# Patient Record
Sex: Female | Born: 1940 | Race: White | Hispanic: No | Marital: Married | Smoking: Never smoker
Health system: Southern US, Community
[De-identification: ages and names within clinical notes are randomized; demographics above are authoritative.]

## PROBLEM LIST (undated history)

## (undated) DIAGNOSIS — E119 Type 2 diabetes mellitus without complications: Secondary | ICD-10-CM

## (undated) DIAGNOSIS — M1990 Unspecified osteoarthritis, unspecified site: Secondary | ICD-10-CM

## (undated) DIAGNOSIS — R5600 Simple febrile convulsions: Secondary | ICD-10-CM

## (undated) DIAGNOSIS — T4145XA Adverse effect of unspecified anesthetic, initial encounter: Secondary | ICD-10-CM

## (undated) DIAGNOSIS — I10 Essential (primary) hypertension: Secondary | ICD-10-CM

## (undated) DIAGNOSIS — E785 Hyperlipidemia, unspecified: Secondary | ICD-10-CM

## (undated) DIAGNOSIS — I351 Nonrheumatic aortic (valve) insufficiency: Secondary | ICD-10-CM

## (undated) DIAGNOSIS — B029 Zoster without complications: Secondary | ICD-10-CM

## (undated) DIAGNOSIS — D649 Anemia, unspecified: Secondary | ICD-10-CM

## (undated) DIAGNOSIS — M810 Age-related osteoporosis without current pathological fracture: Secondary | ICD-10-CM

## (undated) DIAGNOSIS — G4700 Insomnia, unspecified: Secondary | ICD-10-CM

## (undated) DIAGNOSIS — R519 Headache, unspecified: Secondary | ICD-10-CM

## (undated) DIAGNOSIS — K219 Gastro-esophageal reflux disease without esophagitis: Secondary | ICD-10-CM

## (undated) DIAGNOSIS — Z8489 Family history of other specified conditions: Secondary | ICD-10-CM

## (undated) DIAGNOSIS — N189 Chronic kidney disease, unspecified: Secondary | ICD-10-CM

## (undated) DIAGNOSIS — S069X9A Unspecified intracranial injury with loss of consciousness of unspecified duration, initial encounter: Secondary | ICD-10-CM

## (undated) DIAGNOSIS — S069X1A Unspecified intracranial injury with loss of consciousness of 30 minutes or less, initial encounter: Secondary | ICD-10-CM

## (undated) DIAGNOSIS — E559 Vitamin D deficiency, unspecified: Secondary | ICD-10-CM

## (undated) DIAGNOSIS — F419 Anxiety disorder, unspecified: Secondary | ICD-10-CM

## (undated) DIAGNOSIS — E039 Hypothyroidism, unspecified: Secondary | ICD-10-CM

## (undated) DIAGNOSIS — T8859XA Other complications of anesthesia, initial encounter: Secondary | ICD-10-CM

## (undated) DIAGNOSIS — F32A Depression, unspecified: Secondary | ICD-10-CM

## (undated) DIAGNOSIS — J189 Pneumonia, unspecified organism: Secondary | ICD-10-CM

## (undated) DIAGNOSIS — F329 Major depressive disorder, single episode, unspecified: Secondary | ICD-10-CM

## (undated) DIAGNOSIS — R51 Headache: Secondary | ICD-10-CM

## (undated) HISTORY — PX: CATARACT EXTRACTION W/ INTRAOCULAR LENS  IMPLANT, BILATERAL: SHX1307

## (undated) HISTORY — PX: COLONOSCOPY W/ POLYPECTOMY: SHX1380

## (undated) HISTORY — PX: BREAST CYST EXCISION: SHX579

## (undated) HISTORY — DX: Nonrheumatic aortic (valve) insufficiency: I35.1

## (undated) HISTORY — PX: TONSILLECTOMY: SUR1361

## (undated) HISTORY — DX: Essential (primary) hypertension: I10

## (undated) HISTORY — PX: EYE SURGERY: SHX253

---

## 1972-08-25 HISTORY — PX: ORIF TIBIA & FIBULA FRACTURES: SHX2131

## 1972-08-25 HISTORY — PX: MANDIBLE FRACTURE SURGERY: SHX706

## 1986-08-25 HISTORY — PX: RIGHT OOPHORECTOMY: SHX2359

## 1986-08-25 HISTORY — PX: APPENDECTOMY: SHX54

## 1998-01-17 ENCOUNTER — Other Ambulatory Visit: Admission: RE | Admit: 1998-01-17 | Discharge: 1998-01-17 | Payer: Self-pay | Admitting: Family Medicine

## 1999-01-22 ENCOUNTER — Other Ambulatory Visit: Admission: RE | Admit: 1999-01-22 | Discharge: 1999-01-22 | Payer: Self-pay | Admitting: Family Medicine

## 2000-03-25 ENCOUNTER — Other Ambulatory Visit: Admission: RE | Admit: 2000-03-25 | Discharge: 2000-03-25 | Payer: Self-pay | Admitting: Family Medicine

## 2000-03-25 ENCOUNTER — Encounter: Payer: Self-pay | Admitting: Family Medicine

## 2000-03-25 ENCOUNTER — Encounter: Admission: RE | Admit: 2000-03-25 | Discharge: 2000-03-25 | Payer: Self-pay | Admitting: Family Medicine

## 2002-02-08 ENCOUNTER — Encounter: Admission: RE | Admit: 2002-02-08 | Discharge: 2002-02-08 | Payer: Self-pay | Admitting: Family Medicine

## 2002-02-08 ENCOUNTER — Encounter: Payer: Self-pay | Admitting: Family Medicine

## 2003-03-27 ENCOUNTER — Encounter: Admission: RE | Admit: 2003-03-27 | Discharge: 2003-03-27 | Payer: Self-pay | Admitting: Family Medicine

## 2003-03-27 ENCOUNTER — Encounter: Payer: Self-pay | Admitting: Family Medicine

## 2004-05-01 ENCOUNTER — Other Ambulatory Visit: Admission: RE | Admit: 2004-05-01 | Discharge: 2004-05-01 | Payer: Self-pay | Admitting: Family Medicine

## 2004-08-12 ENCOUNTER — Encounter (INDEPENDENT_AMBULATORY_CARE_PROVIDER_SITE_OTHER): Payer: Self-pay | Admitting: *Deleted

## 2004-08-12 ENCOUNTER — Ambulatory Visit (HOSPITAL_COMMUNITY): Admission: RE | Admit: 2004-08-12 | Discharge: 2004-08-12 | Payer: Self-pay | Admitting: Gastroenterology

## 2005-08-27 ENCOUNTER — Encounter: Admission: RE | Admit: 2005-08-27 | Discharge: 2005-08-27 | Payer: Self-pay | Admitting: Family Medicine

## 2006-08-12 ENCOUNTER — Other Ambulatory Visit: Admission: RE | Admit: 2006-08-12 | Discharge: 2006-08-12 | Payer: Self-pay | Admitting: Family Medicine

## 2006-09-03 ENCOUNTER — Encounter: Admission: RE | Admit: 2006-09-03 | Discharge: 2006-09-03 | Payer: Self-pay | Admitting: Family Medicine

## 2008-04-17 ENCOUNTER — Other Ambulatory Visit: Admission: RE | Admit: 2008-04-17 | Discharge: 2008-04-17 | Payer: Self-pay | Admitting: Family Medicine

## 2009-04-06 ENCOUNTER — Encounter: Admission: RE | Admit: 2009-04-06 | Discharge: 2009-04-06 | Payer: Self-pay | Admitting: Family Medicine

## 2009-06-19 ENCOUNTER — Encounter: Admission: RE | Admit: 2009-06-19 | Discharge: 2009-06-19 | Payer: Self-pay | Admitting: Family Medicine

## 2010-02-16 ENCOUNTER — Encounter: Admission: RE | Admit: 2010-02-16 | Discharge: 2010-02-16 | Payer: Self-pay | Admitting: Family Medicine

## 2010-04-08 ENCOUNTER — Encounter: Admission: RE | Admit: 2010-04-08 | Discharge: 2010-04-08 | Payer: Self-pay | Admitting: Neurosurgery

## 2010-05-16 ENCOUNTER — Encounter: Admission: RE | Admit: 2010-05-16 | Discharge: 2010-05-16 | Payer: Self-pay | Admitting: Family Medicine

## 2011-01-10 NOTE — Op Note (Signed)
April Franco, April Franco                ACCOUNT NO.:  [id number]   MEDICAL RECORD NO.:  [id number]          PATIENT TYPE:  AMB   LOCATION:  ENDO                         FACILITY:  [facility]   PHYSICIAN:  Petra Kuba, M.D.    DATE OF BIRTH:  [birth year]   DATE OF PROCEDURE:  08/12/2004  DATE OF DISCHARGE:                                 OPERATIVE REPORT   PROCEDURE PERFORMED:  Colonoscopy with hot biopsy.   ENDOSCOPIST:  Petra Kuba, M.D.   INDICATIONS FOR PROCEDURE:  Screening.   Consent was signed after the risks, benefits, methods and options were  thoroughly discussed in the office.   MEDICINES USED:  Demerol 50 mg, Versed 5 mg.   DESCRIPTION OF PROCEDURE:  Rectal inspection was pertinent for external  hemorrhoids, small.  Digital exam was negative.  A video pediatric  adjustable colonoscope was inserted and easily advanced around the colon to  the cecum.  This did not require any abdominal pressure or any position  changes.  Left-sided diverticula were seen on insertion but no other  abnormalities.  The cecum was identified by the appendiceal orifice and the  ileocecal valve.  The scope was slowly withdrawn.  The prep on the left was  better than the prep on the right.  It did require 1 L of washing and  suctioning overall for adequate visualization.  On slow withdrawal through  the colon, the cecum, ascending, majority of the transverse was normal.  In  the distal transverse, an edematous fold versus polyp was seen and was hot  biopsied times one.  The scope was further withdrawn.  About five other tiny  to small descending and sigmoid polyps were seen and were all hot biopsied  and put in the same container.  Left-sided diverticula, scattered were  confirmed.  Anorectal pull-through and retroflexion confirmed some small  hemorrhoids.  Scope was straightened and readvanced a short ways up the left  side of the colon, air was suctioned, scope removed.  The patient tolerated  the  procedure well.  The only complication was a bout of hypotension prior  to starting the procedure after the IV was put in.  No other complications.   ENDOSCOPIC DIAGNOSIS:  1.  Internal and external hemorrhoids.  2.  Left-sided diverticula.  3.  Multiple tiny left-sided polyps hot biopsied.  4.  Distal transverse questionable edematous fold hot biopsied.  5.  Otherwise within normal limits to the cecum.   PLAN:  Await pathology to determine the future of colonic screening.  Happy  to see back p.r.n.  Otherwise return care to Dr. Clarene Duke for the customary  health care maintenance to include yearly rectals and guaiacs.       MEM/MEDQ  D:  08/12/2004  T:  08/13/2004  Job:  [id number]   cc:   Caryn Bee L. Little, M.D.  [address]  [place]  [place] [address]  Fax: [phone]

## 2013-04-20 ENCOUNTER — Other Ambulatory Visit: Payer: Self-pay | Admitting: Family Medicine

## 2013-04-20 DIAGNOSIS — Z1231 Encounter for screening mammogram for malignant neoplasm of breast: Secondary | ICD-10-CM

## 2013-04-20 DIAGNOSIS — M810 Age-related osteoporosis without current pathological fracture: Secondary | ICD-10-CM

## 2013-07-12 ENCOUNTER — Other Ambulatory Visit: Payer: Self-pay

## 2013-07-12 ENCOUNTER — Ambulatory Visit: Payer: Self-pay

## 2013-08-10 ENCOUNTER — Ambulatory Visit
Admission: RE | Admit: 2013-08-10 | Discharge: 2013-08-10 | Disposition: A | Discharge status: Home / Self Care | Payer: Medicare Other | Source: Ambulatory Visit | Attending: Family Medicine | Admitting: Family Medicine

## 2013-08-10 DIAGNOSIS — M810 Age-related osteoporosis without current pathological fracture: Secondary | ICD-10-CM

## 2013-08-10 DIAGNOSIS — Z1231 Encounter for screening mammogram for malignant neoplasm of breast: Secondary | ICD-10-CM

## 2014-07-18 ENCOUNTER — Other Ambulatory Visit: Payer: Self-pay | Admitting: Orthopedic Surgery

## 2014-08-07 ENCOUNTER — Ambulatory Visit (HOSPITAL_COMMUNITY)
Admission: RE | Admit: 2014-08-07 | Discharge: 2014-08-07 | Disposition: A | Discharge status: Home / Self Care | Payer: Medicare Other | Source: Ambulatory Visit | Attending: Orthopedic Surgery | Admitting: Orthopedic Surgery

## 2014-08-07 ENCOUNTER — Encounter (HOSPITAL_COMMUNITY)
Admission: RE | Admit: 2014-08-07 | Discharge: 2014-08-07 | Disposition: A | Discharge status: Home / Self Care | Payer: Medicare Other | Source: Ambulatory Visit | Attending: Orthopedic Surgery | Admitting: Orthopedic Surgery

## 2014-08-07 ENCOUNTER — Encounter (HOSPITAL_COMMUNITY): Payer: Self-pay

## 2014-08-07 DIAGNOSIS — Z01818 Encounter for other preprocedural examination: Secondary | ICD-10-CM | POA: Diagnosis not present

## 2014-08-07 DIAGNOSIS — M47894 Other spondylosis, thoracic region: Secondary | ICD-10-CM | POA: Diagnosis not present

## 2014-08-07 HISTORY — DX: Major depressive disorder, single episode, unspecified: F32.9

## 2014-08-07 HISTORY — DX: Anxiety disorder, unspecified: F41.9

## 2014-08-07 HISTORY — DX: Insomnia, unspecified: G47.00

## 2014-08-07 HISTORY — DX: Unspecified osteoarthritis, unspecified site: M19.90

## 2014-08-07 HISTORY — DX: Essential (primary) hypertension: I10

## 2014-08-07 HISTORY — DX: Adverse effect of unspecified anesthetic, initial encounter: T41.45XA

## 2014-08-07 HISTORY — DX: Hyperlipidemia, unspecified: E78.5

## 2014-08-07 HISTORY — DX: Pneumonia, unspecified organism: J18.9

## 2014-08-07 HISTORY — DX: Other complications of anesthesia, initial encounter: T88.59XA

## 2014-08-07 HISTORY — DX: Hypothyroidism, unspecified: E03.9

## 2014-08-07 HISTORY — DX: Depression, unspecified: F32.A

## 2014-08-07 LAB — URINALYSIS, ROUTINE W REFLEX MICROSCOPIC
BILIRUBIN URINE: NEGATIVE
GLUCOSE, UA: NEGATIVE mg/dL
Ketones, ur: NEGATIVE mg/dL
Nitrite: NEGATIVE
PROTEIN: NEGATIVE mg/dL
Specific Gravity, Urine: 1.014 (ref 1.005–1.030)
UROBILINOGEN UA: 0.2 mg/dL (ref 0.0–1.0)
pH: 6 (ref 5.0–8.0)

## 2014-08-07 LAB — APTT: aPTT: 32 seconds (ref 24–37)

## 2014-08-07 LAB — COMPREHENSIVE METABOLIC PANEL
ALK PHOS: 63 U/L (ref 39–117)
ALT: 24 U/L (ref 0–35)
ANION GAP: 17 — AB (ref 5–15)
AST: 18 U/L (ref 0–37)
Albumin: 4 g/dL (ref 3.5–5.2)
BUN: 15 mg/dL (ref 6–23)
CALCIUM: 10.9 mg/dL — AB (ref 8.4–10.5)
CO2: 25 mEq/L (ref 19–32)
CREATININE: 0.8 mg/dL (ref 0.50–1.10)
Chloride: 97 mEq/L (ref 96–112)
GFR calc Af Amer: 84 mL/min — ABNORMAL LOW (ref >90)
GFR calc non Af Amer: 72 mL/min — ABNORMAL LOW (ref >90)
Glucose, Bld: 159 mg/dL — ABNORMAL HIGH (ref 70–99)
POTASSIUM: 3.9 meq/L (ref 3.7–5.3)
Sodium: 139 mEq/L (ref 137–147)
TOTAL PROTEIN: 7.5 g/dL (ref 6.0–8.3)
Total Bilirubin: 0.3 mg/dL (ref 0.3–1.2)

## 2014-08-07 LAB — CBC WITH DIFFERENTIAL/PLATELET
BASOS PCT: 1 % (ref 0–1)
Basophils Absolute: 0.1 10*3/uL (ref 0.0–0.1)
EOS ABS: 0.4 10*3/uL (ref 0.0–0.7)
EOS PCT: 6 % — AB (ref 0–5)
HCT: 40.9 % (ref 36.0–46.0)
HEMOGLOBIN: 13.3 g/dL (ref 12.0–15.0)
Lymphocytes Relative: 29 % (ref 12–46)
Lymphs Abs: 2.1 10*3/uL (ref 0.7–4.0)
MCH: 29.1 pg (ref 26.0–34.0)
MCHC: 32.5 g/dL (ref 30.0–36.0)
MCV: 89.5 fL (ref 78.0–100.0)
MONOS PCT: 6 % (ref 3–12)
Monocytes Absolute: 0.4 10*3/uL (ref 0.1–1.0)
NEUTROS PCT: 58 % (ref 43–77)
Neutro Abs: 4.3 10*3/uL (ref 1.7–7.7)
Platelets: 400 10*3/uL (ref 150–400)
RBC: 4.57 MIL/uL (ref 3.87–5.11)
RDW: 14 % (ref 11.5–15.5)
WBC: 7.4 10*3/uL (ref 4.0–10.5)

## 2014-08-07 LAB — TYPE AND SCREEN
ABO/RH(D): O NEG
Antibody Screen: NEGATIVE

## 2014-08-07 LAB — SURGICAL PCR SCREEN
MRSA, PCR: NEGATIVE
STAPHYLOCOCCUS AUREUS: NEGATIVE

## 2014-08-07 LAB — URINE MICROSCOPIC-ADD ON

## 2014-08-07 LAB — PROTIME-INR
INR: 0.97 (ref 0.00–1.49)
PROTHROMBIN TIME: 13 s (ref 11.6–15.2)

## 2014-08-07 LAB — ABO/RH: ABO/RH(D): O NEG

## 2014-08-07 NOTE — Pre-Procedure Instructions (Signed)
April Franco  08/07/2014   Your procedure is scheduled on:  Wednesday August 16, 2014 at 12:45 PM.  Report to [facility] Admitting at 10:45 AM.  Call this number if you have problems the morning of surgery: [phone]   For any other questions call [phone] M-F from 8am-4pm   Remember:   Do not eat food or drink liquids after midnight.   Take these medicines the morning of surgery with A SIP OF WATER: Amlodipine, Levothyroxine, Escitalopram, Tramadol if needed, Alprazolam if needed, and Hydrocodone if needed     Do NOT take any diabetic medications the morning of your surgery   Do not wear jewelry, make-up or nail polish.  Do not wear lotions, powders, or perfumes.  Do not shave 48 hours prior to surgery.   Do not bring valuables to the hospital.  [facility] is not responsible for any belongings or valuables.               Contacts, dentures or bridgework may not be worn into surgery.  Leave suitcase in the car. After surgery it may be brought to your room.  For patients admitted to the hospital, discharge time is determined by your treatment team.               Patients discharged the day of surgery will not be allowed to drive home.  Name and phone number of your driver:   Special Instructions: Shower using CHG soap the night before and the morning of your surgery    Please read over the following fact sheets that you were given: Pain Booklet, Coughing and Deep Breathing, Blood Transfusion Information, Total Joint Packet, MRSA Information and Surgical Site Infection Prevention

## 2014-08-07 NOTE — Progress Notes (Signed)
PCP is Hulan Fess. Patient denied having any acute cardiac or pulmonary issues.

## 2014-08-08 NOTE — Progress Notes (Signed)
Anesthesia Chart Review:  Pt is 73 year old female scheduled for L total knee arthroplasty on 08/16/2014 with Dr. Berenice Primas.   PMH: HTN, DM, hyperlipidemia. BMI 39  Preoperative labs reviewed.    EKG: Sinus rhythm with occasional PVCs. Nonspecific T wave abnormality. No significant change since last tracing.   If no changes, I anticipate pt can proceed with surgery as scheduled.   Willeen Cass, FNP-BC [facility]/Anesthesiology Phone: [phone] 08/08/2014 4:02 PM

## 2014-08-15 MED ORDER — CHLORHEXIDINE GLUCONATE 4 % EX LIQD
60.0000 mL | Freq: Once | CUTANEOUS | Status: DC
  Filled 2014-08-15: qty 60

## 2014-08-15 MED ORDER — CEFAZOLIN SODIUM-DEXTROSE 2-3 GM-% IV SOLR
2.0000 g | INTRAVENOUS | Status: AC
  Administered 2014-08-16: 2 g via INTRAVENOUS
  Filled 2014-08-15: qty 50

## 2014-08-16 ENCOUNTER — Inpatient Hospital Stay (HOSPITAL_COMMUNITY)
Admission: RE | Admit: 2014-08-16 | Discharge: 2014-08-19 | DRG: 982 | Disposition: A | Discharge status: Home Health | Payer: Medicare Other | Source: Ambulatory Visit | Attending: Orthopedic Surgery | Admitting: Orthopedic Surgery

## 2014-08-16 ENCOUNTER — Inpatient Hospital Stay (HOSPITAL_COMMUNITY): Payer: Medicare Other | Admitting: Anesthesiology

## 2014-08-16 ENCOUNTER — Inpatient Hospital Stay (HOSPITAL_COMMUNITY): Payer: Medicare Other | Admitting: Emergency Medicine

## 2014-08-16 ENCOUNTER — Encounter (HOSPITAL_COMMUNITY)
Admission: RE | Disposition: A | Discharge status: Home Health | Payer: Self-pay | Source: Ambulatory Visit | Attending: Orthopedic Surgery

## 2014-08-16 ENCOUNTER — Encounter (HOSPITAL_COMMUNITY): Payer: Self-pay | Admitting: *Deleted

## 2014-08-16 DIAGNOSIS — Z7982 Long term (current) use of aspirin: Secondary | ICD-10-CM

## 2014-08-16 DIAGNOSIS — E119 Type 2 diabetes mellitus without complications: Secondary | ICD-10-CM | POA: Diagnosis present

## 2014-08-16 DIAGNOSIS — Z79899 Other long term (current) drug therapy: Secondary | ICD-10-CM | POA: Diagnosis not present

## 2014-08-16 DIAGNOSIS — E039 Hypothyroidism, unspecified: Secondary | ICD-10-CM | POA: Diagnosis present

## 2014-08-16 DIAGNOSIS — D62 Acute posthemorrhagic anemia: Secondary | ICD-10-CM | POA: Diagnosis not present

## 2014-08-16 DIAGNOSIS — Z6838 Body mass index (BMI) 38.0-38.9, adult: Secondary | ICD-10-CM

## 2014-08-16 DIAGNOSIS — I10 Essential (primary) hypertension: Secondary | ICD-10-CM | POA: Diagnosis present

## 2014-08-16 DIAGNOSIS — M1712 Unilateral primary osteoarthritis, left knee: Secondary | ICD-10-CM | POA: Diagnosis present

## 2014-08-16 DIAGNOSIS — F419 Anxiety disorder, unspecified: Secondary | ICD-10-CM | POA: Diagnosis present

## 2014-08-16 DIAGNOSIS — E785 Hyperlipidemia, unspecified: Secondary | ICD-10-CM | POA: Diagnosis present

## 2014-08-16 DIAGNOSIS — F329 Major depressive disorder, single episode, unspecified: Secondary | ICD-10-CM | POA: Diagnosis present

## 2014-08-16 DIAGNOSIS — M25562 Pain in left knee: Secondary | ICD-10-CM | POA: Diagnosis present

## 2014-08-16 DIAGNOSIS — G4700 Insomnia, unspecified: Secondary | ICD-10-CM | POA: Diagnosis present

## 2014-08-16 HISTORY — DX: Anemia, unspecified: D64.9

## 2014-08-16 HISTORY — DX: Headache: R51

## 2014-08-16 HISTORY — DX: Type 2 diabetes mellitus without complications: E11.9

## 2014-08-16 HISTORY — DX: Headache, unspecified: R51.9

## 2014-08-16 HISTORY — PX: TOTAL KNEE ARTHROPLASTY: SHX125

## 2014-08-16 HISTORY — DX: Simple febrile convulsions: R56.00

## 2014-08-16 LAB — GLUCOSE, CAPILLARY
GLUCOSE-CAPILLARY: 162 mg/dL — AB (ref 70–99)
GLUCOSE-CAPILLARY: 113 mg/dL — AB (ref 70–99)
Glucose-Capillary: 129 mg/dL — ABNORMAL HIGH (ref 70–99)
Glucose-Capillary: 190 mg/dL — ABNORMAL HIGH (ref 70–99)

## 2014-08-16 SURGERY — ARTHROPLASTY, KNEE, TOTAL
Anesthesia: Monitor Anesthesia Care | Laterality: Left

## 2014-08-16 MED ORDER — ZOLPIDEM TARTRATE 5 MG PO TABS
5.0000 mg | ORAL_TABLET | Freq: Every evening | ORAL | Status: DC | PRN
  Administered 2014-08-18: 5 mg via ORAL
  Filled 2014-08-16: qty 1

## 2014-08-16 MED ORDER — BISACODYL 5 MG PO TBEC
5.0000 mg | DELAYED_RELEASE_TABLET | Freq: Every day | ORAL | Status: DC | PRN
  Administered 2014-08-18: 5 mg via ORAL
  Filled 2014-08-16: qty 1

## 2014-08-16 MED ORDER — HYDROMORPHONE HCL 1 MG/ML IJ SOLN
INTRAMUSCULAR | Status: AC
  Filled 2014-08-16: qty 1

## 2014-08-16 MED ORDER — BUPIVACAINE HCL (PF) 0.25 % IJ SOLN
INTRAMUSCULAR | Status: DC | PRN
  Administered 2014-08-16: 20 mL

## 2014-08-16 MED ORDER — LACTATED RINGERS IV SOLN
INTRAVENOUS | Status: DC
  Administered 2014-08-16 (×2): via INTRAVENOUS

## 2014-08-16 MED ORDER — ESCITALOPRAM OXALATE 20 MG PO TABS
20.0000 mg | ORAL_TABLET | Freq: Every day | ORAL | Status: DC
  Administered 2014-08-17 – 2014-08-19 (×3): 20 mg via ORAL
  Filled 2014-08-16 (×3): qty 1

## 2014-08-16 MED ORDER — TRANEXAMIC ACID 100 MG/ML IV SOLN
1000.0000 mg | INTRAVENOUS | Status: DC
  Filled 2014-08-16: qty 10

## 2014-08-16 MED ORDER — ASPIRIN EC 325 MG PO TBEC
325.0000 mg | DELAYED_RELEASE_TABLET | Freq: Two times a day (BID) | ORAL | Status: DC
  Administered 2014-08-17 – 2014-08-19 (×5): 325 mg via ORAL
  Filled 2014-08-16 (×8): qty 1

## 2014-08-16 MED ORDER — ACETAMINOPHEN 160 MG/5ML PO SOLN: 325.0000 mg | ORAL | Status: DC | PRN

## 2014-08-16 MED ORDER — OXYCODONE-ACETAMINOPHEN 5-325 MG PO TABS
1.0000 | ORAL_TABLET | ORAL | Status: DC | PRN
  Administered 2014-08-16 – 2014-08-19 (×13): 2 via ORAL
  Filled 2014-08-16 (×14): qty 2

## 2014-08-16 MED ORDER — SODIUM CHLORIDE 0.9 % IJ SOLN
INTRAMUSCULAR | Status: AC
  Filled 2014-08-16: qty 10

## 2014-08-16 MED ORDER — LIDOCAINE HCL (CARDIAC) 20 MG/ML IV SOLN
INTRAVENOUS | Status: AC
  Filled 2014-08-16: qty 5

## 2014-08-16 MED ORDER — HYDROMORPHONE HCL 1 MG/ML IJ SOLN
0.2500 mg | INTRAMUSCULAR | Status: DC | PRN
  Administered 2014-08-16 (×4): 0.5 mg via INTRAVENOUS

## 2014-08-16 MED ORDER — GLIMEPIRIDE 2 MG PO TABS
2.0000 mg | ORAL_TABLET | Freq: Every day | ORAL | Status: DC
  Administered 2014-08-17 – 2014-08-19 (×3): 2 mg via ORAL
  Filled 2014-08-16 (×4): qty 1

## 2014-08-16 MED ORDER — METFORMIN HCL 500 MG PO TABS
1000.0000 mg | ORAL_TABLET | Freq: Two times a day (BID) | ORAL | Status: DC
  Administered 2014-08-17: 1000 mg via ORAL
  Administered 2014-08-18: 500 mg via ORAL
  Administered 2014-08-18 – 2014-08-19 (×2): 1000 mg via ORAL
  Filled 2014-08-16 (×8): qty 2

## 2014-08-16 MED ORDER — OXYCODONE HCL 5 MG PO TABS: 5.0000 mg | ORAL_TABLET | Freq: Once | ORAL | Status: DC | PRN

## 2014-08-16 MED ORDER — ACETAMINOPHEN 325 MG PO TABS: 325.0000 mg | ORAL_TABLET | ORAL | Status: DC | PRN

## 2014-08-16 MED ORDER — LEVOTHYROXINE SODIUM 50 MCG PO TABS
50.0000 ug | ORAL_TABLET | Freq: Every day | ORAL | Status: DC
  Administered 2014-08-17 – 2014-08-19 (×3): 50 ug via ORAL
  Filled 2014-08-16 (×4): qty 1

## 2014-08-16 MED ORDER — FENTANYL CITRATE 0.05 MG/ML IJ SOLN
INTRAMUSCULAR | Status: DC | PRN
  Administered 2014-08-16 (×5): 50 ug via INTRAVENOUS

## 2014-08-16 MED ORDER — PROPOFOL 10 MG/ML IV BOLUS
INTRAVENOUS | Status: AC
  Filled 2014-08-16: qty 20

## 2014-08-16 MED ORDER — FENTANYL CITRATE 0.05 MG/ML IJ SOLN
50.0000 ug | INTRAMUSCULAR | Status: DC | PRN
  Filled 2014-08-16: qty 2

## 2014-08-16 MED ORDER — ACETAMINOPHEN 650 MG RE SUPP: 650.0000 mg | Freq: Four times a day (QID) | RECTAL | Status: DC | PRN

## 2014-08-16 MED ORDER — HYDROMORPHONE HCL 1 MG/ML IJ SOLN
INTRAMUSCULAR | Status: DC | PRN
  Administered 2014-08-16 (×2): .5 mg via INTRAVENOUS

## 2014-08-16 MED ORDER — 0.9 % SODIUM CHLORIDE (POUR BTL) OPTIME
TOPICAL | Status: DC | PRN
  Administered 2014-08-16: 1000 mL

## 2014-08-16 MED ORDER — KETOROLAC TROMETHAMINE 15 MG/ML IJ SOLN
INTRAMUSCULAR | Status: AC
Start: 2014-08-16 — End: 2014-08-16
  Filled 2014-08-16: qty 1

## 2014-08-16 MED ORDER — BUPIVACAINE LIPOSOME 1.3 % IJ SUSP
20.0000 mL | Freq: Once | INTRAMUSCULAR | Status: AC
  Administered 2014-08-16: 20 mL
  Filled 2014-08-16: qty 20

## 2014-08-16 MED ORDER — AMLODIPINE BESYLATE 10 MG PO TABS
10.0000 mg | ORAL_TABLET | Freq: Every day | ORAL | Status: DC
  Administered 2014-08-18 – 2014-08-19 (×2): 10 mg via ORAL
  Filled 2014-08-16 (×4): qty 1

## 2014-08-16 MED ORDER — CEFAZOLIN SODIUM-DEXTROSE 2-3 GM-% IV SOLR
2.0000 g | Freq: Four times a day (QID) | INTRAVENOUS | Status: AC
  Administered 2014-08-16 – 2014-08-17 (×2): 2 g via INTRAVENOUS
  Filled 2014-08-16 (×2): qty 50

## 2014-08-16 MED ORDER — ALPRAZOLAM 0.25 MG PO TABS
0.2500 mg | ORAL_TABLET | Freq: Two times a day (BID) | ORAL | Status: DC | PRN
  Administered 2014-08-17 – 2014-08-18 (×4): 0.25 mg via ORAL
  Filled 2014-08-16 (×4): qty 1

## 2014-08-16 MED ORDER — BUPIVACAINE HCL (PF) 0.25 % IJ SOLN
INTRAMUSCULAR | Status: AC
  Filled 2014-08-16: qty 30

## 2014-08-16 MED ORDER — DOCUSATE SODIUM 100 MG PO CAPS
100.0000 mg | ORAL_CAPSULE | Freq: Two times a day (BID) | ORAL | Status: DC
  Administered 2014-08-16 – 2014-08-19 (×6): 100 mg via ORAL
  Filled 2014-08-16 (×7): qty 1

## 2014-08-16 MED ORDER — HYDROXYZINE HCL 25 MG PO TABS
25.0000 mg | ORAL_TABLET | Freq: Three times a day (TID) | ORAL | Status: DC | PRN
  Administered 2014-08-17: 25 mg via ORAL
  Filled 2014-08-16: qty 1

## 2014-08-16 MED ORDER — METHOCARBAMOL 1000 MG/10ML IJ SOLN
500.0000 mg | Freq: Four times a day (QID) | INTRAVENOUS | Status: DC | PRN
  Filled 2014-08-16: qty 5

## 2014-08-16 MED ORDER — PROMETHAZINE HCL 25 MG/ML IJ SOLN: 6.2500 mg | Freq: Four times a day (QID) | INTRAMUSCULAR | Status: DC | PRN

## 2014-08-16 MED ORDER — LISINOPRIL 20 MG PO TABS
20.0000 mg | ORAL_TABLET | Freq: Every day | ORAL | Status: DC
  Administered 2014-08-18 – 2014-08-19 (×2): 20 mg via ORAL
  Filled 2014-08-16 (×3): qty 1

## 2014-08-16 MED ORDER — ASPIRIN EC 325 MG PO TBEC: 325.0000 mg | DELAYED_RELEASE_TABLET | Freq: Two times a day (BID) | ORAL | Status: DC

## 2014-08-16 MED ORDER — ONDANSETRON HCL 4 MG PO TABS: 4.0000 mg | ORAL_TABLET | Freq: Four times a day (QID) | ORAL | Status: DC | PRN

## 2014-08-16 MED ORDER — HYDROMORPHONE HCL 1 MG/ML IJ SOLN
0.5000 mg | INTRAMUSCULAR | Status: DC | PRN
  Administered 2014-08-16 – 2014-08-18 (×7): 1 mg via INTRAVENOUS
  Filled 2014-08-16 (×9): qty 1

## 2014-08-16 MED ORDER — MIDAZOLAM HCL 2 MG/2ML IJ SOLN
INTRAMUSCULAR | Status: AC
  Filled 2014-08-16: qty 2

## 2014-08-16 MED ORDER — SODIUM CHLORIDE 0.9 % IV SOLN
INTRAVENOUS | Status: DC
  Administered 2014-08-16 – 2014-08-17 (×2): via INTRAVENOUS

## 2014-08-16 MED ORDER — HYDROCHLOROTHIAZIDE 25 MG PO TABS
25.0000 mg | ORAL_TABLET | ORAL | Status: DC
  Administered 2014-08-19: 25 mg via ORAL
  Filled 2014-08-16 (×2): qty 1

## 2014-08-16 MED ORDER — PROPOFOL INFUSION 10 MG/ML OPTIME
INTRAVENOUS | Status: DC | PRN
  Administered 2014-08-16: 50 ug/kg/min via INTRAVENOUS

## 2014-08-16 MED ORDER — KETOROLAC TROMETHAMINE 15 MG/ML IJ SOLN
7.5000 mg | Freq: Three times a day (TID) | INTRAMUSCULAR | Status: AC
  Administered 2014-08-16 – 2014-08-17 (×3): 7.5 mg via INTRAVENOUS
  Filled 2014-08-16 (×2): qty 1

## 2014-08-16 MED ORDER — ACETAMINOPHEN 325 MG PO TABS
650.0000 mg | ORAL_TABLET | Freq: Four times a day (QID) | ORAL | Status: DC | PRN
  Administered 2014-08-18 – 2014-08-19 (×2): 650 mg via ORAL
  Filled 2014-08-16 (×2): qty 2

## 2014-08-16 MED ORDER — SODIUM CHLORIDE 0.9 % IR SOLN
Status: DC | PRN
  Administered 2014-08-16: 1000 mL

## 2014-08-16 MED ORDER — INSULIN ASPART 100 UNIT/ML ~~LOC~~ SOLN
0.0000 [IU] | Freq: Three times a day (TID) | SUBCUTANEOUS | Status: DC
  Administered 2014-08-17: 3 [IU] via SUBCUTANEOUS
  Administered 2014-08-17 – 2014-08-19 (×3): 2 [IU] via SUBCUTANEOUS

## 2014-08-16 MED ORDER — BUPIVACAINE IN DEXTROSE 0.75-8.25 % IT SOLN
INTRATHECAL | Status: DC | PRN
  Administered 2014-08-16: 1.8 mL via INTRATHECAL

## 2014-08-16 MED ORDER — DIPHENHYDRAMINE HCL 12.5 MG/5ML PO ELIX
12.5000 mg | ORAL_SOLUTION | ORAL | Status: DC | PRN
  Administered 2014-08-17 (×5): 25 mg via ORAL
  Filled 2014-08-16 (×5): qty 10

## 2014-08-16 MED ORDER — OXYCODONE HCL 5 MG/5ML PO SOLN: 5.0000 mg | Freq: Once | ORAL | Status: DC | PRN

## 2014-08-16 MED ORDER — TRANEXAMIC ACID 100 MG/ML IV SOLN
1000.0000 mg | INTRAVENOUS | Status: DC
  Administered 2014-08-16: 1000 mg via INTRAVENOUS

## 2014-08-16 MED ORDER — METHOCARBAMOL 500 MG PO TABS: 500.0000 mg | ORAL_TABLET | Freq: Three times a day (TID) | ORAL | Status: DC | PRN

## 2014-08-16 MED ORDER — FENTANYL CITRATE 0.05 MG/ML IJ SOLN
INTRAMUSCULAR | Status: AC
  Filled 2014-08-16: qty 5

## 2014-08-16 MED ORDER — POLYETHYLENE GLYCOL 3350 17 G PO PACK: 17.0000 g | PACK | Freq: Every day | ORAL | Status: DC | PRN

## 2014-08-16 MED ORDER — FLEET ENEMA 7-19 GM/118ML RE ENEM: 1.0000 | ENEMA | Freq: Once | RECTAL | Status: AC | PRN

## 2014-08-16 MED ORDER — ONDANSETRON HCL 4 MG/2ML IJ SOLN: 4.0000 mg | Freq: Four times a day (QID) | INTRAMUSCULAR | Status: DC | PRN

## 2014-08-16 MED ORDER — MIDAZOLAM HCL 2 MG/2ML IJ SOLN
1.0000 mg | INTRAMUSCULAR | Status: DC | PRN
  Administered 2014-08-16: 2 mg via INTRAVENOUS
  Filled 2014-08-16: qty 2

## 2014-08-16 MED ORDER — LIDOCAINE HCL (CARDIAC) 20 MG/ML IV SOLN
INTRAVENOUS | Status: DC | PRN
  Administered 2014-08-16: 40 mg via INTRAVENOUS

## 2014-08-16 MED ORDER — OXYCODONE HCL 5 MG PO TABS
5.0000 mg | ORAL_TABLET | ORAL | Status: DC | PRN
  Administered 2014-08-17 – 2014-08-19 (×9): 5 mg via ORAL
  Filled 2014-08-16 (×8): qty 1

## 2014-08-16 MED ORDER — METHOCARBAMOL 500 MG PO TABS
500.0000 mg | ORAL_TABLET | Freq: Four times a day (QID) | ORAL | Status: DC | PRN
  Administered 2014-08-16 – 2014-08-18 (×8): 500 mg via ORAL
  Filled 2014-08-16 (×8): qty 1

## 2014-08-16 MED ORDER — HYDROMORPHONE HCL 1 MG/ML IJ SOLN
0.5000 mg | INTRAMUSCULAR | Status: AC | PRN
  Administered 2014-08-16: 1 mg via INTRAVENOUS
  Filled 2014-08-16: qty 1

## 2014-08-16 MED ORDER — OXYCODONE-ACETAMINOPHEN 5-325 MG PO TABS: 1.0000 | ORAL_TABLET | Freq: Four times a day (QID) | ORAL | Status: DC | PRN

## 2014-08-16 MED ORDER — ALUM & MAG HYDROXIDE-SIMETH 200-200-20 MG/5ML PO SUSP: 30.0000 mL | ORAL | Status: DC | PRN

## 2014-08-16 SURGICAL SUPPLY — 69 items
APL SKNCLS STERI-STRIP NONHPOA (GAUZE/BANDAGES/DRESSINGS) ×1
BANDAGE ESMARK 6X9 LF (GAUZE/BANDAGES/DRESSINGS) ×1 IMPLANT
BENZOIN TINCTURE PRP APPL 2/3 (GAUZE/BANDAGES/DRESSINGS) ×3 IMPLANT
BLADE SAGITTAL 25.0X1.19X90 (BLADE) ×2 IMPLANT
BLADE SAGITTAL 25.0X1.19X90MM (BLADE) ×1
BLADE SAW SAG 90X13X1.27 (BLADE) ×3 IMPLANT
BNDG CMPR 9X6 STRL LF SNTH (GAUZE/BANDAGES/DRESSINGS) ×1
BNDG CMPR MED 10X6 ELC LF (GAUZE/BANDAGES/DRESSINGS) ×1
BNDG ELASTIC 6X10 VLCR STRL LF (GAUZE/BANDAGES/DRESSINGS) ×2 IMPLANT
BNDG ESMARK 6X9 LF (GAUZE/BANDAGES/DRESSINGS) ×3
BOWL SMART MIX CTS (DISPOSABLE) ×3 IMPLANT
CAP KNEE TOTAL 3 SIGMA ×2 IMPLANT
CEMENT HV SMART SET (Cement) ×6 IMPLANT
CLOSURE STERI-STRIP 1/2X4 (GAUZE/BANDAGES/DRESSINGS) ×1
CLOSURE WOUND 1/2 X4 (GAUZE/BANDAGES/DRESSINGS) ×1
CLSR STERI-STRIP ANTIMIC 1/2X4 (GAUZE/BANDAGES/DRESSINGS) ×1 IMPLANT
COVER SURGICAL LIGHT HANDLE (MISCELLANEOUS) ×3 IMPLANT
CUFF TOURNIQUET SINGLE 34IN LL (TOURNIQUET CUFF) ×3 IMPLANT
CUFF TOURNIQUET SINGLE 44IN (TOURNIQUET CUFF) IMPLANT
DRAPE EXTREMITY T 121X128X90 (DRAPE) ×3 IMPLANT
DRAPE IMP U-DRAPE 54X76 (DRAPES) ×3 IMPLANT
DRAPE U-SHAPE 47X51 STRL (DRAPES) ×3 IMPLANT
DRSG PAD ABDOMINAL 8X10 ST (GAUZE/BANDAGES/DRESSINGS) ×3 IMPLANT
DURAPREP 26ML APPLICATOR (WOUND CARE) ×3 IMPLANT
ELECT REM PT RETURN 9FT ADLT (ELECTROSURGICAL) ×3
ELECTRODE REM PT RTRN 9FT ADLT (ELECTROSURGICAL) ×1 IMPLANT
EVACUATOR 1/8 PVC DRAIN (DRAIN) ×3 IMPLANT
FACESHIELD WRAPAROUND (MASK) ×3 IMPLANT
FACESHIELD WRAPAROUND OR TEAM (MASK) ×1 IMPLANT
GAUZE SPONGE 4X4 12PLY STRL (GAUZE/BANDAGES/DRESSINGS) ×3 IMPLANT
GAUZE XEROFORM 5X9 LF (GAUZE/BANDAGES/DRESSINGS) ×3 IMPLANT
GLOVE BIOGEL PI IND STRL 8 (GLOVE) ×2 IMPLANT
GLOVE BIOGEL PI INDICATOR 8 (GLOVE) ×4
GLOVE ECLIPSE 7.5 STRL STRAW (GLOVE) ×6 IMPLANT
GOWN STRL REUS W/ TWL LRG LVL3 (GOWN DISPOSABLE) ×1 IMPLANT
GOWN STRL REUS W/ TWL XL LVL3 (GOWN DISPOSABLE) ×2 IMPLANT
GOWN STRL REUS W/TWL LRG LVL3 (GOWN DISPOSABLE) ×6
GOWN STRL REUS W/TWL XL LVL3 (GOWN DISPOSABLE) ×6
HANDPIECE INTERPULSE COAX TIP (DISPOSABLE) ×3
HOOD PEEL AWAY FACE SHEILD DIS (HOOD) ×9 IMPLANT
IMMOBILIZER KNEE 20 (SOFTGOODS) IMPLANT
IMMOBILIZER KNEE 22 UNIV (SOFTGOODS) ×3 IMPLANT
KIT BASIN OR (CUSTOM PROCEDURE TRAY) ×3 IMPLANT
KIT ROOM TURNOVER OR (KITS) ×3 IMPLANT
MANIFOLD NEPTUNE II (INSTRUMENTS) ×3 IMPLANT
NDL SPNL 22GX3.5 QUINCKE BK (NEEDLE) ×1 IMPLANT
NEEDLE SPNL 22GX3.5 QUINCKE BK (NEEDLE) ×3 IMPLANT
NS IRRIG 1000ML POUR BTL (IV SOLUTION) ×3 IMPLANT
PACK TOTAL JOINT (CUSTOM PROCEDURE TRAY) ×3 IMPLANT
PACK UNIVERSAL I (CUSTOM PROCEDURE TRAY) ×3 IMPLANT
PAD ARMBOARD 7.5X6 YLW CONV (MISCELLANEOUS) ×6 IMPLANT
PAD CAST 4YDX4 CTTN HI CHSV (CAST SUPPLIES) ×1 IMPLANT
PADDING CAST COTTON 4X4 STRL (CAST SUPPLIES) ×3
PADDING CAST COTTON 6X4 STRL (CAST SUPPLIES) ×2 IMPLANT
SET HNDPC FAN SPRY TIP SCT (DISPOSABLE) ×1 IMPLANT
STAPLER VISISTAT 35W (STAPLE) IMPLANT
STRIP CLOSURE SKIN 1/2X4 (GAUZE/BANDAGES/DRESSINGS) ×2 IMPLANT
SUCTION FRAZIER TIP 10 FR DISP (SUCTIONS) ×3 IMPLANT
SUT MNCRL AB 3-0 PS2 18 (SUTURE) IMPLANT
SUT VIC AB 0 CTB1 27 (SUTURE) ×6 IMPLANT
SUT VIC AB 1 CT1 27 (SUTURE) ×6
SUT VIC AB 1 CT1 27XBRD ANBCTR (SUTURE) ×2 IMPLANT
SUT VIC AB 2-0 CTB1 (SUTURE) ×6 IMPLANT
SYR 50ML LL SCALE MARK (SYRINGE) ×3 IMPLANT
SYR CONTROL 10ML LL (SYRINGE) IMPLANT
TOWEL OR 17X24 6PK STRL BLUE (TOWEL DISPOSABLE) ×3 IMPLANT
TOWEL OR 17X26 10 PK STRL BLUE (TOWEL DISPOSABLE) ×3 IMPLANT
TRAY FOLEY CATH 16FRSI W/METER (SET/KITS/TRAYS/PACK) ×1 IMPLANT
WATER STERILE IRR 1000ML POUR (IV SOLUTION) ×2 IMPLANT

## 2014-08-16 NOTE — Progress Notes (Signed)
Orthopedic Tech Progress Note Patient Details:  April Franco 11/08/1940 [id number] Off cpm at 7:50 pm Patient ID: [name], female   DOB: [birth year], 73 y.o.   MRN: [id number]   [name] 08/16/2014, 7:53 PM

## 2014-08-16 NOTE — Anesthesia Procedure Notes (Signed)
Spinal Patient location during procedure: OR Staffing Anesthesiologist: Ragan Duhon, CHRIS Preanesthetic Checklist Completed: patient identified, surgical consent, pre-op evaluation, timeout performed, IV checked, risks and benefits discussed and monitors and equipment checked Spinal Block Patient position: sitting Prep: site prepped and draped and DuraPrep Patient monitoring: heart rate, cardiac monitor, continuous pulse ox and blood pressure Approach: midline Location: L4-5 Injection technique: single-shot Needle Needle type: Pencan  Needle gauge: 24 G Needle length: 10 cm Assessment Sensory level: T6
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
## 2014-08-16 NOTE — Progress Notes (Signed)
Orthopedic Tech Progress Note Patient Details:  April Franco 31-Jan-1941 [id number]  CPM Left Knee CPM Left Knee: On Left Knee Flexion (Degrees): 50 Left Knee Extension (Degrees): 0 Additional Comments: applied ohf to bed   [name] 08/16/2014, 6:59 PM

## 2014-08-16 NOTE — Discharge Instructions (Signed)
Total Knee Replacement, Care After °Refer to this sheet in the next few weeks. These instructions provide you with information on caring for yourself after your procedure. Your health care provider also may give you specific instructions. Your treatment has been planned according to the most current medical practices, but problems sometimes occur. Call your health care provider if you have any problems or questions after your procedure. °HOME CARE INSTRUCTIONS  °· See a physical therapist as directed by your health care provider. °· Take medicines only as directed by your health care provider. °· Avoid lifting or driving until you are instructed otherwise. °· If you have been sent home with a continuous passive motion machine, use it as directed by your health care provider. °SEEK MEDICAL CARE IF: °· You have difficulty breathing. °· You have drainage, redness, swelling, or pain at your incision site. °· You have a bad smell coming from your incision site. °· You have persistent bleeding from your incision site. °· Your incision breaks open after sutures (stitches) or staples have been removed. °· You have a fever. °SEEK IMMEDIATE MEDICAL CARE IF:  °· You have a rash. °· You have pain or swelling in your calf or thigh. °· You have shortness of breath or chest pain. °· Your range of motion in your knee is decreasing rather than increasing. °MAKE SURE YOU:  °· Understand these instructions. °· Will watch your condition. °· Will get help right away if you are not doing well or get worse. °Document Released: 02/28/2005 Document Revised: 12/26/2013 Document Reviewed: 09/30/2011 °[employer]® Patient Information ©2015 [employer]. This information is not intended to replace advice given to you by your health care provider. Make sure you discuss any questions you have with your health care provider. ° °

## 2014-08-16 NOTE — Transfer of Care (Signed)
Immediate Anesthesia Transfer of Care Note  Patient: April Franco  Procedure(s) Performed: Procedure(s): TOTAL KNEE ARTHROPLASTY (Left)  Patient Location: PACU  Anesthesia Type:Spinal  Level of Consciousness: awake, alert  and oriented  Airway & Oxygen Therapy: Patient Spontanous Breathing and Patient connected to nasal cannula oxygen  Post-op Assessment: Report given to PACU RN, Post -op Vital signs reviewed and stable and Patient moving all extremities X 4  Post vital signs: Reviewed and stable  Complications: No apparent anesthesia complications

## 2014-08-16 NOTE — Anesthesia Preprocedure Evaluation (Addendum)
Anesthesia Evaluation  Patient identified by MRN, date of birth, ID band Patient awake    Reviewed: Allergy & Precautions, H&P , NPO status   History of Anesthesia Complications Negative for: history of anesthetic complications  Airway Mallampati: II  TM Distance: >3 FB Neck ROM: Full    Dental  (+) Teeth Intact   Pulmonary neg shortness of breath, neg sleep apnea, neg COPDneg recent URI,  breath sounds clear to auscultation        Cardiovascular hypertension, Pt. on medications - angina- Past MI, - CABG and - CHF - dysrhythmias - Valvular Problems/MurmursRhythm:Regular     Neuro/Psych PSYCHIATRIC DISORDERS Anxiety Depression negative neurological ROS     GI/Hepatic Neg liver ROS,   Endo/Other  diabetes, Type 2, Oral Hypoglycemic AgentsHypothyroidism Morbid obesity  Renal/GU negative Renal ROS     Musculoskeletal  (+) Arthritis -, Osteoarthritis,    Abdominal   Peds  Hematology negative hematology ROS (+)   Anesthesia Other Findings   Reproductive/Obstetrics                            Anesthesia Physical Anesthesia Plan  ASA: III  Anesthesia Plan: Spinal and MAC   Post-op Pain Management:    Induction: Intravenous  Airway Management Planned: Simple Face Mask and Natural Airway  Additional Equipment: None  Intra-op Plan:   Post-operative Plan:   Informed Consent: I have reviewed the patients History and Physical, chart, labs and discussed the procedure including the risks, benefits and alternatives for the proposed anesthesia with the patient or authorized representative who has indicated his/her understanding and acceptance.   Dental advisory given  Plan Discussed with: CRNA and Surgeon  Anesthesia Plan Comments:        Anesthesia Quick Evaluation

## 2014-08-16 NOTE — Brief Op Note (Signed)
08/16/2014  2:45 PM  PATIENT:  April Franco  73 y.o. female  PRE-OPERATIVE DIAGNOSIS:  DEGENERATIVE JOINT DISEASE LEFT KNEE  POST-OPERATIVE DIAGNOSIS:  DEGENERATIVE JOINT DISEASE LEFT KNEE  PROCEDURE:  Procedure(s): TOTAL KNEE ARTHROPLASTY (Left)  SURGEON:  Surgeon(s) and Role:    * Alta Corning, MD - Primary  PHYSICIAN ASSISTANT:   ASSISTANTS: bethune    ANESTHESIA:   spinal  EBL:     BLOOD ADMINISTERED:none  DRAINS: none and (1) Hemovact drain(s) in the  l knee with  Suction Open   LOCAL MEDICATIONS USED:  MARCAINE     SPECIMEN:  No Specimen  DISPOSITION OF SPECIMEN:  N/A  COUNTS:  YES  TOURNIQUET:   Total Tourniquet Time Documented: Thigh (Left) - 60 minutes Total: Thigh (Left) - 60 minutes   DICTATION: .Other Dictation: Dictation Number [id number]  PLAN OF CARE: Admit to inpatient   PATIENT DISPOSITION:  PACU - hemodynamically stable.   Delay start of Pharmacological VTE agent (>24hrs) due to surgical blood loss or risk of bleeding: no

## 2014-08-17 ENCOUNTER — Encounter (HOSPITAL_COMMUNITY): Payer: Self-pay | Admitting: Orthopedic Surgery

## 2014-08-17 LAB — GLUCOSE, CAPILLARY
GLUCOSE-CAPILLARY: 254 mg/dL — AB (ref 70–99)
GLUCOSE-CAPILLARY: 144 mg/dL — AB (ref 70–99)
Glucose-Capillary: 163 mg/dL — ABNORMAL HIGH (ref 70–99)
Glucose-Capillary: 110 mg/dL — ABNORMAL HIGH (ref 70–99)
Glucose-Capillary: 67 mg/dL — ABNORMAL LOW (ref 70–99)
Glucose-Capillary: 118 mg/dL — ABNORMAL HIGH (ref 70–99)

## 2014-08-17 LAB — BASIC METABOLIC PANEL
Anion gap: 8 (ref 5–15)
BUN: 15 mg/dL (ref 6–23)
CO2: 27 mmol/L (ref 19–32)
Calcium: 8.6 mg/dL (ref 8.4–10.5)
Chloride: 99 mEq/L (ref 96–112)
Creatinine, Ser: 1.14 mg/dL — ABNORMAL HIGH (ref 0.50–1.10)
GFR calc non Af Amer: 47 mL/min — ABNORMAL LOW (ref >90)
GFR, EST AFRICAN AMERICAN: 55 mL/min — AB (ref >90)
Glucose, Bld: 166 mg/dL — ABNORMAL HIGH (ref 70–99)
Potassium: 4.8 mmol/L (ref 3.5–5.1)
Sodium: 134 mmol/L — ABNORMAL LOW (ref 135–145)

## 2014-08-17 LAB — CBC
HEMATOCRIT: 31.8 % — AB (ref 36.0–46.0)
Hemoglobin: 10 g/dL — ABNORMAL LOW (ref 12.0–15.0)
MCH: 28.7 pg (ref 26.0–34.0)
MCHC: 31.4 g/dL (ref 30.0–36.0)
MCV: 91.4 fL (ref 78.0–100.0)
Platelets: 318 10*3/uL (ref 150–400)
RBC: 3.48 MIL/uL — ABNORMAL LOW (ref 3.87–5.11)
RDW: 14.4 % (ref 11.5–15.5)
WBC: 8.1 10*3/uL (ref 4.0–10.5)

## 2014-08-17 MED ORDER — KETOROLAC TROMETHAMINE 15 MG/ML IJ SOLN
INTRAMUSCULAR | Status: AC
  Filled 2014-08-17: qty 1

## 2014-08-17 NOTE — Op Note (Signed)
[name], Franco                ACCOUNT NO.:  [id number]  MEDICAL RECORD NO.:  [id number]  LOCATION:  5N07C                        FACILITY:  [facility]  PHYSICIAN:  Alta Corning, M.D.   DATE OF BIRTH:  May 27, 1941  DATE OF PROCEDURE:  08/16/2014 DATE OF DISCHARGE:                              OPERATIVE REPORT   PREOPERATIVE DIAGNOSIS:  End-stage degenerative joint disease, left knee.  POSTOPERATIVE DIAGNOSIS:  End-stage degenerative joint disease, left knee.  PROCEDURE:  Left total knee replacement with Sigma system, size 3 femur, size 3 tibia, 10 mm bridging bearing, and a 35 mm all-polyethylene patella.  SURGEON:  Alta Corning, MD  ASSISTANT:  Gary Fleet, PA  ANESTHESIA:  General.  BRIEF HISTORY:  April Franco is a 73 year old female with a long history of significant complaints of left knee pain.  She had been treated conservatively for prolonged period of time and after failure of all conservative care and x-rays showing bone-on-bone change,  she was taken to the operating room for left total knee replacement.  She was having night pain, light activity pain, and had failed physical therapy, activity modification, and injection therapy.  DESCRIPTION OF PROCEDURE:  The patient was taken to the operating room. After adequate anesthesia was obtained by spinal anesthetic, the patient was placed supine on the operating table.  Left leg was prepped and draped in usual sterile fashion.  Following this, the leg was exsanguinated.  Blood pressure tourniquet was inflated to 350 mmHg. Following this, a midline incision was made to the subcutaneous tissue down the level of extensor mechanism and a medial parapatellar arthrotomy was undertaken.  Once this was done, medial and lateral meniscus was removed, retropatellar fat pad, synovium in the anterior aspect of the femur and the anterior and posterior cruciates.  Once this was done, the tibia was cut perpendicular to its long  axis.  Attention was then turned to the femur where an intramedullary pilot hole was drilled and the femur was cut perpendicular to the long axis with a 5- degree valgus inclination and 10 mm of distal bone.  Once this was done, spacer blocks were put in place that came into easy full extension.  At this point, attention was turned towards the femur, it was sized to a 3, it was drilled and anterior and posterior cuts were made, chamfers and box.  Attention turned to the tibia, sized to a 3, it was drilled and keeled.  Attention was then turned towards placing the 10 mm bearing trial.  Excellent range of motion and stability was achieved.  Attention was turned to the patella, cut down the level of 13 mm and 35 paddle was chosen.  The lugs were drilled and lugs were drilled for the femur.  At this point, all trial components were removed.  The final components were cemented into place after pulsatile lavage, irrigation and suctioning the knee dry.  Final components were cemented in place size 3 femur, size 3 tibia, 10 mm bridging bearing trial was placed and 35 all poly patella was placed and held with a clamp.  All of the excess bone and cement was removed and the cement was allowed  to completely harden. Once the cement was completely hardened, attention was turned towards the knee where the tourniquet was let down.  All bleeding was controlled with electrocautery, 40 mL of 20 mL Exparel and 20 mL of 0.25% Marcaine were instilled throughout the knee for postoperative anesthesia.  At this point, the wound was again irrigated, suctioned dry and a medium Hemovac drain was placed and the capsule was closed with 1 Vicryl running, skin with 0 and 2-0 Vicryl and 3-0 Monocryl.  Benzoin and Steri-Strips were applied.  Sterile compressive dressing was applied.  The patient was taken to the recovery room in satisfactory condition.  Estimated blood loss for the procedure is minimal.     Alta Corning, M.D.     Corliss Skains  D:  08/16/2014  T:  08/17/2014  Job:  [id number]

## 2014-08-17 NOTE — Progress Notes (Signed)
CARE MANAGEMENT NOTE 08/17/2014  Patient:  April Franco, April Franco   Account Number:  [id number]  Date Initiated:  08/17/2014  Documentation initiated by:  [facility]  Subjective/Objective Assessment:   s/p left TKA     Action/Plan:   PT/OT evals-recomended HHPT   Anticipated DC Date:  08/19/2014   Anticipated DC Plan:  [place]  CM consult      [facility] Choice  [place]   Choice offered to / List presented to:  C-1 Patient   DME arranged  3-N-1  [place]  CPM      DME agency  [employer]     [place] arranged  HH-2 PT      [place].   Status of service:  Completed, signed off Medicare Important Message given?   (If response is "NO", the following Medicare IM given date fields will be blank) Date Medicare IM given:   Medicare IM given by:   Date Additional Medicare IM given:   Additional Medicare IM given by:    Discharge Disposition:  [place]  Per UR Regulation:  Reviewed for med. necessity/level of care/duration of stay  If discussed at [place] of Stay Meetings, dates discussed:    Comments:  08/17/14 Spoke with patient about [place], she stated she was set up by MD's office. Contacted Manuela Schwartz at Dr. Berenice Primas office, patient was set up with [facility]. Contacted [facility] they stated that they did not have the patient set up and would not be able to see her until 08/23/14.Contacted [facility], they will be able to see patient  08/20/14. Patient chose [facility]. Contacted Miranda at [facility] and set up [place]. [employer] has delivered CPM, rolling walker and 3N1 to patient's home. Fuller Plan RN, BSN, CCM

## 2014-08-17 NOTE — Progress Notes (Signed)
Hypoglycemic Event  CBG: 67  Treatment: 15 GM carbohydrate snack  Symptoms: None  Follow-up CBG: Time:1730 CBG Result:110  Possible Reasons for Event: Unknown  Comments/MD notified:n/a    [place], April Franco  Remember to initiate Hypoglycemia Order Set & complete

## 2014-08-17 NOTE — Progress Notes (Signed)
Utilization review completed.  

## 2014-08-17 NOTE — Anesthesia Postprocedure Evaluation (Signed)
  Anesthesia Post-op Note  Patient: April Franco  Procedure(s) Performed: Procedure(s): TOTAL KNEE ARTHROPLASTY (Left)  Patient Location: PACU  Anesthesia Type:Spinal  Level of Consciousness: awake  Airway and Oxygen Therapy: Patient Spontanous Breathing  Post-op Pain: mild  Post-op Assessment: Post-op Vital signs reviewed, Patient's Cardiovascular Status Stable, Respiratory Function Stable, Patent Airway, No signs of Nausea or vomiting and Pain level controlled  Post-op Vital Signs: Reviewed and stable  Last Vitals:  Filed Vitals:   08/17/14 0951  BP: 108/50  Pulse:   Temp:   Resp:     Complications: No apparent anesthesia complications

## 2014-08-17 NOTE — Progress Notes (Signed)
Physical Therapy Treatment Patient Details Name: April Franco MRN: [id number] DOB: 07-20-1941 Today's Date: 08/17/2014    History of Present Illness S/p L TKA.  PMH: DM, depression    PT Comments    Pt progressing with mobility. Not sure if she will be ready to DC tomorrow or may need another day.  PT to see 12/25 to continue to progress mobility and practice stairs if able.  Pain may be a limiting factor in progression.     Follow Up Recommendations  Home health PT;Supervision for mobility/OOB     Equipment Recommendations       Recommendations for Other Services OT consult     Precautions / Restrictions Precautions Precautions: Knee Required Braces or Orthoses: Knee Immobilizer - Left Knee Immobilizer - Left: On when out of bed or walking Restrictions Weight Bearing Restrictions: Yes Other Position/Activity Restrictions: WBAT LLE    Mobility  Bed Mobility Overal bed mobility: Needs Assistance Bed Mobility: Sit to Supine       Sit to supine: Min assist   General bed mobility comments: Used rails,, verbal cues for technique  Transfers Overall transfer level: Needs assistance Equipment used: Rolling walker (2 wheeled) Transfers: Sit to/from Stand Sit to Stand: Min assist         General transfer comment: Verbal cues for technique  Ambulation/Gait Ambulation/Gait assistance: Min assist Ambulation Distance (Feet): 60 Feet Assistive device: Rolling walker (2 wheeled) Gait Pattern/deviations: Step-to pattern;Decreased stride length;Antalgic   Gait velocity interpretation: Below normal speed for age/gender General Gait Details: Verbal cues for sequencing   Stairs            Wheelchair Mobility    Modified Rankin (Stroke Patients Only)       Balance                                    Cognition Arousal/Alertness: Awake/alert Behavior During Therapy: WFL for tasks assessed/performed Overall Cognitive Status: Within Functional  Limits for tasks assessed                      Exercises Total Joint Exercises Ankle Circles/Pumps: AROM;5 reps;Both;Supine Straight Leg Raises: AAROM;5 reps;Left;Supine Long Arc Quad: AAROM;Left;5 reps;Seated Knee Flexion: AAROM;Left;5 reps;Seated Goniometric ROM: 64 degrees AAROM in flexion    General Comments General comments (skin integrity, edema, etc.): No family present. Pt concerned about swelling in her thigh. Does not appear to be excessive. Pt used BSC during session that was placed in bathroom that she walked to.         Pertinent Vitals/Pain Pain Assessment: 0-10 Pain Score: 7  Pain Location: L knee and thigh Pain Descriptors / Indicators: Sore;Tender Pain Intervention(s): Limited activity within patient's tolerance;Monitored during session;Premedicated before session    Home Living Family/patient expects to be discharged to:: Private residence                    Prior Function            PT Goals (current goals can now be found in the care plan section) Progress towards PT goals: Progressing toward goals    Frequency  7X/week    PT Plan Current plan remains appropriate    Co-evaluation             End of Session Equipment Utilized During Treatment: Gait belt;Left knee immobilizer Activity Tolerance: Patient tolerated treatment well Patient left: in  bed;with call bell/phone within reach;with bed alarm set     Time: 1255-1352 PT Time Calculation (min) (ACUTE ONLY): 57 min  Charges:  $Gait Training: 23-37 mins $Therapeutic Exercise: 23-37 mins                    G Codes:      Melvern Banker 09/03/14, 2:07 PM  Lavonia Dana, [place] 09-03-2014

## 2014-08-17 NOTE — Evaluation (Signed)
Physical Therapy Evaluation Patient Details Name: April Franco MRN: [id number] DOB: 1940/11/15 Today's Date: 08/17/2014   History of Present Illness  S/p L TKA.  PMH: DM, depression  Clinical Impression  Pt is s/p TKA resulting in the deficits listed below (see PT Problem List). Pt will benefit from skilled PT to increase their independence and safety with mobility to allow discharge to home.  Per MD note will shoot for DC home tomorrow. Pt may benefit from OT consult to maximize safety and independence at home.      Follow Up Recommendations Home health PT;Supervision for mobility/OOB    Equipment Recommendations  Rolling walker with 5" wheels (RW, CPM, and 3-N-1 already delivered to pts home)    Recommendations for Other Services OT consult     Precautions / Restrictions Precautions Precautions: Knee Required Braces or Orthoses: Knee Immobilizer - Left Knee Immobilizer - Left: On when out of bed or walking Restrictions Weight Bearing Restrictions: Yes Other Position/Activity Restrictions: WBAT LLE      Mobility  Bed Mobility Overal bed mobility: Needs Assistance Bed Mobility: Supine to Sit     Supine to sit: Min assist;HOB elevated     General bed mobility comments: Used rails,, verbal cues for technique  Transfers Overall transfer level: Needs assistance Equipment used: Rolling walker (2 wheeled) Transfers: Sit to/from Stand Sit to Stand: Mod assist         General transfer comment: Verbal and tactile cues for proper technique  Ambulation/Gait Ambulation/Gait assistance: Min assist Ambulation Distance (Feet): 10 Feet Assistive device: Rolling walker (2 wheeled) Gait Pattern/deviations: Step-to pattern;Decreased stride length;Ataxic   Gait velocity interpretation: Below normal speed for age/gender General Gait Details: Verbal cues for sequencing  Stairs            Wheelchair Mobility    Modified Rankin (Stroke Patients Only)        Balance                                             Pertinent Vitals/Pain Pain Assessment: 0-10 Pain Score: 7  Pain Location: L knee Pain Intervention(s): Limited activity within patient's tolerance;Monitored during session;Premedicated before session    Home Living Family/patient expects to be discharged to:: Private residence Living Arrangements: Spouse/significant other Available Help at Discharge: Family;Available 24 hours/day (Per pt available 95% of the time) Type of Home: House Home Access: Stairs to enter Entrance Stairs-Rails: Right Entrance Stairs-Number of Steps: 4-5 Home Layout: Multi-level Home Equipment: None      Prior Function Level of Independence: Independent               Hand Dominance        Extremity/Trunk Assessment   Upper Extremity Assessment: Overall WFL for tasks assessed           Lower Extremity Assessment: LLE deficits/detail   LLE Deficits / Details: decreased ROM and strength in LLE secondary to L TKA  Cervical / Trunk Assessment: Normal  Communication   Communication: No difficulties  Cognition Arousal/Alertness: Awake/alert Behavior During Therapy: WFL for tasks assessed/performed Overall Cognitive Status: Within Functional Limits for tasks assessed                      General Comments General comments (skin integrity, edema, etc.): No family present.  Pt stated her pain was under much better  control  than it was earlier today.  Pt would like to Dc home tomorrow, but she is unsure if she will be moving well enough at that time.    Exercises Total Joint Exercises Ankle Circles/Pumps: AROM;Both;5 reps;Supine Quad Sets: AROM;Left;5 reps;Supine Short Arc Quad: AAROM;Left;5 reps;Supine Heel Slides: AAROM;Left;5 reps;Supine Hip ABduction/ADduction: AAROM;Left;5 reps;Supine Straight Leg Raises: AAROM;Left;Supine (2 reps)      Assessment/Plan    PT Assessment Patient needs continued PT  services  PT Diagnosis Difficulty walking;Acute pain   PT Problem List Decreased strength;Decreased range of motion;Decreased activity tolerance;Decreased mobility;Decreased knowledge of use of DME;Decreased knowledge of precautions;Obesity;Pain  PT Treatment Interventions DME instruction;Gait training;Stair training;Therapeutic exercise;Patient/family education   PT Goals (Current goals can be found in the Care Plan section) Acute Rehab PT Goals Patient Stated Goal: to be able to walk with a cane PT Goal Formulation: With patient Time For Goal Achievement: 08/21/14 Potential to Achieve Goals: Good    Frequency 7X/week   Barriers to discharge        Co-evaluation               End of Session Equipment Utilized During Treatment: Gait belt;Left knee immobilizer Activity Tolerance: Patient tolerated treatment well Patient left: in chair;with call bell/phone within reach (OK to not use chair alarm per RN. Advised pt to not get up alone) Nurse Communication: Mobility status         Time: 7414-2395 PT Time Calculation (min) (ACUTE ONLY): 39 min   Charges:   PT Evaluation $Initial PT Evaluation Tier I: 1 Procedure PT Treatments $Gait Training: 8-22 mins $Therapeutic Exercise: 8-22 mins   PT G Codes:          Melvern Banker 08/17/2014, 9:47 AM  Lavonia Dana, PT  [phone] 08/17/2014

## 2014-08-18 LAB — GLUCOSE, CAPILLARY
Glucose-Capillary: 139 mg/dL — ABNORMAL HIGH (ref 70–99)
Glucose-Capillary: 108 mg/dL — ABNORMAL HIGH (ref 70–99)
Glucose-Capillary: 80 mg/dL (ref 70–99)
Glucose-Capillary: 106 mg/dL — ABNORMAL HIGH (ref 70–99)

## 2014-08-18 LAB — CBC
HEMATOCRIT: 27.7 % — AB (ref 36.0–46.0)
Hemoglobin: 8.8 g/dL — ABNORMAL LOW (ref 12.0–15.0)
MCH: 27.8 pg (ref 26.0–34.0)
MCHC: 31.8 g/dL (ref 30.0–36.0)
MCV: 87.4 fL (ref 78.0–100.0)
Platelets: 279 10*3/uL (ref 150–400)
RBC: 3.17 MIL/uL — ABNORMAL LOW (ref 3.87–5.11)
RDW: 14.4 % (ref 11.5–15.5)
WBC: 8.1 10*3/uL (ref 4.0–10.5)

## 2014-08-18 NOTE — Progress Notes (Signed)
Physical Therapy Treatment Patient Details Name: April Franco MRN: [id number] DOB: [birth year] Today's Date: 08/18/2014    History of Present Illness S/p L TKA.  PMH: DM, depression    PT Comments    Patient making progress with mobility and gait.  Was able to complete stair training today (would like to practice again at next session).  Patient c/o increased pain (9/10) and sore throat.  RN notified.  Follow Up Recommendations  Home health PT;Supervision for mobility/OOB     Equipment Recommendations  None recommended by PT (Patient has needed equipment)    Recommendations for Other Services       Precautions / Restrictions Precautions Precautions: Knee Required Braces or Orthoses: Knee Immobilizer - Left Knee Immobilizer - Left: On when out of bed or walking Restrictions Weight Bearing Restrictions: Yes LLE Weight Bearing: Weight bearing as tolerated    Mobility  Bed Mobility Overal bed mobility: Needs Assistance Bed Mobility: Supine to Sit     Supine to sit: Min assist     General bed mobility comments: Verbal cues for technique.  Assist to move LLE off of bed.  Transfers Overall transfer level: Needs assistance Equipment used: Rolling walker (2 wheeled) Transfers: Sit to/from Stand Sit to Stand: Min assist         General transfer comment: Verbal cues for hand placement and technique.  Assist to rise to standing and for balance initially.  Ambulation/Gait Ambulation/Gait assistance: Min guard Ambulation Distance (Feet): 70 Feet Assistive device: Rolling walker (2 wheeled) Gait Pattern/deviations: Step-to pattern;Decreased stance time - left;Decreased step length - right;Decreased weight shift to left;Antalgic Gait velocity: Decreased Gait velocity interpretation: Below normal speed for age/gender General Gait Details: Verbal cues for safe use of RW, and to stand upright during gait.  Patient c/o pain with gait.  RN notified.   Stairs Stairs:  Yes Stairs assistance: Min assist Stair Management: One rail Right;With cane;Step to pattern;Forwards Number of Stairs: 3 General stair comments: Verbal cues for negotiating stairs with 1 rail and cane using step-to pattern.  Patient able to perform with min assist for safety.  Wheelchair Mobility    Modified Rankin (Stroke Patients Only)       Balance                                    Cognition Arousal/Alertness: Awake/alert Behavior During Therapy: WFL for tasks assessed/performed Overall Cognitive Status: Within Functional Limits for tasks assessed                      Exercises      General Comments        Pertinent Vitals/Pain Pain Assessment: 0-10 Pain Score: 9  Pain Location: Lt knee Pain Descriptors / Indicators: Aching;Sore Pain Intervention(s): Repositioned;Patient requesting pain meds-RN notified    Home Living                      Prior Function            PT Goals (current goals can now be found in the care plan section) Progress towards PT goals: Progressing toward goals    Frequency  7X/week    PT Plan Current plan remains appropriate;Equipment recommendations need to be updated    Co-evaluation             End of Session Equipment Utilized During Treatment: Gait belt;Left knee immobilizer  Activity Tolerance: Patient limited by pain Patient left: in chair;with call bell/phone within reach     Time: 1034-1059 PT Time Calculation (min) (ACUTE ONLY): 25 min  Charges:  $Gait Training: 23-37 mins                    G Codes:      Despina Franco 2014-08-20, 2:00 PM April Franco. April Franco, [name] Pager [phone]

## 2014-08-18 NOTE — Progress Notes (Signed)
   PATIENT ID: April Franco   2 Days Post-Op Procedure(s) (LRB): TOTAL KNEE ARTHROPLASTY (Left)  Subjective: Reports in some pain today left knee. Tells me she has pain with walking with therapy but determined to improve. No other complaints or concerns.   Objective:  Filed Vitals:   08/18/14 0643  BP: 131/60  Pulse: 111  Temp: 99 F (37.2 C)  Resp: 18     L knee dressing c/d/i Knee immobilizer adjusted Wiggles toes distally NVI  Labs:   Recent Labs  08/17/14 0547 08/18/14 0533  HGB 10.0* 8.8*   Recent Labs  08/17/14 0547 08/18/14 0533  WBC 8.1 8.1  RBC 3.48* 3.17*  HCT 31.8* 27.7*  PLT 318 279   Recent Labs  08/17/14 0547  NA 134*  K 4.8  CL 99  CO2 27  BUN 15  CREATININE 1.14*  GLUCOSE 166*  CALCIUM 8.6    Assessment and Plan: 2 days s/p left TKA Still requiring dilaudid for pain control, try to ween off IV pain rx and stick to percocet Up with PT today, recommend OT and will order eval and tx D/c per PT, she may need another day. nursing tells me home equipment can't be delivered today so we may need to wait until tomorrow any way. Scripts in chart, will fill out med rec and d/c paperwork if she is cleared today by OT/PT and has equipment  VTE proph: asa 325mg  BID, SCDs

## 2014-08-19 LAB — CBC
HEMATOCRIT: 25.6 % — AB (ref 36.0–46.0)
HEMOGLOBIN: 8 g/dL — AB (ref 12.0–15.0)
MCH: 27.4 pg (ref 26.0–34.0)
MCHC: 31.3 g/dL (ref 30.0–36.0)
MCV: 87.7 fL (ref 78.0–100.0)
Platelets: 277 10*3/uL (ref 150–400)
RBC: 2.92 MIL/uL — ABNORMAL LOW (ref 3.87–5.11)
RDW: 14.5 % (ref 11.5–15.5)
WBC: 8.1 10*3/uL (ref 4.0–10.5)

## 2014-08-19 LAB — GLUCOSE, CAPILLARY
GLUCOSE-CAPILLARY: 130 mg/dL — AB (ref 70–99)
GLUCOSE-CAPILLARY: 125 mg/dL — AB (ref 70–99)

## 2014-08-19 NOTE — Progress Notes (Signed)
Physical Therapy Treatment Patient Details Name: April Franco MRN: [id number] DOB: 29-Apr-1941 Today's Date: 08/19/2014    History of Present Illness S/p L TKA.  PMH: DM, depression    PT Comments    Patient doing well with mobility and gait.  Reviewed stairs with patient, and educated husband on proper guarding technique.  Patient ready for discharge with f/u HHPT.  Follow Up Recommendations  Home health PT;Supervision for mobility/OOB     Equipment Recommendations  None recommended by PT (RW and 3-in-1 delivered to home)    Recommendations for Other Services       Precautions / Restrictions Precautions Precautions: Knee Required Braces or Orthoses: Knee Immobilizer - Left Knee Immobilizer - Left: On when out of bed or walking Restrictions Weight Bearing Restrictions: Yes LLE Weight Bearing: Weight bearing as tolerated    Mobility  Bed Mobility               General bed mobility comments: Instructed husband on assisting patient with supine <> sit.  Discussed placement of CPM on bed at home.  Transfers Overall transfer level: Needs assistance Equipment used: Rolling walker (2 wheeled) Transfers: Sit to/from Stand Sit to Stand: Min guard         General transfer comment: Patient using correct hand placement for transfers.  Assist for balance/safety.  Ambulation/Gait Ambulation/Gait assistance: Min guard Ambulation Distance (Feet): 30 Feet Assistive device: Rolling walker (2 wheeled) Gait Pattern/deviations: Step-through pattern;Decreased step length - left;Decreased stance time - left;Decreased stride length;Decreased weight shift to left;Antalgic Gait velocity: Decreased Gait velocity interpretation: Below normal speed for age/gender General Gait Details: Verbal cues for safe use of RW, and to stand upright during gait.    Stairs Stairs: Yes Stairs assistance: Min assist Stair Management: One rail Right;Step to pattern;Forwards;With cane Number of  Stairs: 2 General stair comments: Reviewed proper negotiation of stairs with 1 rail and cane using step-to pattern.  Instructed husband on proper guarding technique.  Patient able to perform once with PT and once with husband assisting with min cues.  Wheelchair Mobility    Modified Rankin (Stroke Patients Only)       Balance                                    Cognition Arousal/Alertness: Awake/alert Behavior During Therapy: WFL for tasks assessed/performed Overall Cognitive Status: Within Functional Limits for tasks assessed                      Exercises      General Comments        Pertinent Vitals/Pain Pain Assessment: 0-10 Pain Score: 7  Pain Location: Lt knee Pain Descriptors / Indicators: Aching;Sore Pain Intervention(s): Monitored during session;Repositioned    Home Living                      Prior Function            PT Goals (current goals can now be found in the care plan section) Progress towards PT goals: Progressing toward goals    Frequency  7X/week    PT Plan Current plan remains appropriate    Co-evaluation             End of Session Equipment Utilized During Treatment: Gait belt;Left knee immobilizer Activity Tolerance: Patient limited by pain;Patient limited by fatigue Patient left: in chair;with call bell/phone  within reach;with family/visitor present     Time: 1215-1244 PT Time Calculation (min) (ACUTE ONLY): 29 min  Charges:  $Gait Training: 23-37 mins                    G Codes:      [name] 09-16-14, 7:40 PM [name]. [name], [place] Pager [phone]

## 2014-08-19 NOTE — Progress Notes (Signed)
08/19/14 1320 nsg Patient discharge to home per wheelchair accompanied by NT and husband. Discharge instructions discussed with patient and husband. Case manager notified and ok to go home. Per patient equipments needed are at home. No further questions.

## 2014-08-19 NOTE — Progress Notes (Signed)
   PATIENT ID: April Franco   3 Days Post-Op Procedure(s) (LRB): TOTAL KNEE ARTHROPLASTY (Left)  Subjective: Reports feeling better. Would like to work with therapy one more time today.Denies lightheadedness, dizziness.  Pain in left knee better controlled. Eager to go home today.   Objective:  Filed Vitals:   08/19/14 0601  BP: 119/64  Pulse: 78  Temp: 98.1 F (36.7 C)  Resp: 18     Awake, alert, orientated L dressing c/d/i Calves soft, nontender Distally NVI  Labs:   Recent Labs  08/17/14 0547 08/18/14 0533 08/19/14 0500  HGB 10.0* 8.8* 8.0*   Recent Labs  08/18/14 0533 08/19/14 0500  WBC 8.1 8.1  RBC 3.17* 2.92*  HCT 27.7* 25.6*  PLT 279 277   Recent Labs  08/17/14 0547  NA 134*  K 4.8  CL 99  CO2 27  BUN 15  CREATININE 1.14*  GLUCOSE 166*  CALCIUM 8.6    Assessment and Plan: 3 days s/p left TKA Up with PT one final time today ABLA-expected post op, asymptomatic and will not transfuse Home on percocet/robaxin as she is tolerating that well since noon yesterday D/c home today Scripts on chart  VTE proph: asa 325mg  BID, SCDs

## 2014-08-19 NOTE — Evaluation (Signed)
Occupational Therapy Evaluation Patient Details Name: April Franco MRN: [id number] DOB: [birth year] Today's Date: 08/19/2014    History of Present Illness S/p L TKA.  PMH: DM, depression   Clinical Impression   Pt admitted with above.  She presents to OT with increased anxiety and decreased ability to perform BADLs and functional transfers.   Currently, she is unable to access Lt. Foot for LB ADLs.  Discussed use of AE with she and spouse, but they report that spouse will assist her at discharge.  Encouraged pt to attempt LB ADLs daily before asking for assist to facilitate independence and to facilitate knee flexion.  Deferred shower transfer to [facility] therapy due to pt anxiety and feel she will carry over info better if practice occurs in her home environment.   Pt with complaint of sore throat and Rt posterior proximal thigh/buttocks (7/10) - RN made aware.   Spouse in at end of session with multiple complaints - RN and PT notified. Feel she would benefit from [facility].       Follow Up Recommendations  Home health OT;Supervision/Assistance - 24 hour    Equipment Recommendations  3 in 1 bedside comode    Recommendations for Other Services       Precautions / Restrictions Precautions Precautions: Knee Required Braces or Orthoses: Knee Immobilizer - Left Knee Immobilizer - Left: On when out of bed or walking Restrictions Weight Bearing Restrictions: Yes LLE Weight Bearing: Weight bearing as tolerated Other Position/Activity Restrictions: WBAT LLE      Mobility Bed Mobility Overal bed mobility: Needs Assistance Bed Mobility: Supine to Sit     Supine to sit: Min assist     General bed mobility comments: verbal cues for technique and assist for Lt. LE  Transfers Overall transfer level: Needs assistance Equipment used: Rolling walker (2 wheeled) Transfers: Sit to/from [facility] Sit to Stand: Min assist Stand pivot transfers: Min guard       General  transfer comment: Verbal cues for hand placement.  Pt initially required assist to power up into standing, but on next sit to stand was able to perform with min guard assist    Balance Overall balance assessment: Needs assistance Sitting-balance support: Feet supported Sitting balance-Leahy Scale: Good       Standing balance-Leahy Scale: Fair                 High Level Balance Comments: Pt fearful of falling             ADL Overall ADL's : Needs assistance/impaired Eating/Feeding: Independent   Grooming: Wash/dry hands;Wash/dry face;Oral care;Brushing hair;Min guard;Standing   Upper Body Bathing: Supervision/ safety;Set up;Sitting   Lower Body Bathing: Moderate assistance;Sit to/from stand   Upper Body Dressing : Supervision/safety;Set up;Sitting   Lower Body Dressing: Maximal assistance;Sit to/from stand Lower Body Dressing Details (indicate cue type and reason): Discussed use of AE with pt and spouse, but they report that spouse will assist her as needed at home.   Toilet Transfer: Min guard;Stand-pivot;Ambulation;Comfort height toilet;BSC;RW Toilet Transfer Details (indicate cue type and reason): Pt requires verbal cues for hand placement  Toileting- Clothing Manipulation and Hygiene: Min guard;Sit to/from stand Toileting - Clothing Manipulation Details (indicate cue type and reason): requires close min guard assist Tub/ Shower Transfer: Copy Details (indicate cue type and reason): Pt is very anxious.  Recommend pt practice actual shower transfer with [place] therapies before she attempts to perform on her own at home.  both pt  and spouse verbalize understanding  Functional mobility during ADLs: Min guard General ADL Comments: Pt and spouse were instructed in donning/doffing KI.   Pt with complaints of sore throat and posterior proximal thigh/buttock pain 7/10.  RN notified.      Vision                     Perception     Praxis       Pertinent Vitals/Pain Pain Assessment: 0-10 Pain Score: 7  Pain Location: Rt. knee and Lt proximal, posterior thigh/buttocks  Pain Descriptors / Indicators: Aching;Burning;Constant Pain Intervention(s): Repositioned;Premedicated before session     Hand Dominance Right   Extremity/Trunk Assessment Upper Extremity Assessment Upper Extremity Assessment: Overall WFL for tasks assessed   Lower Extremity Assessment Lower Extremity Assessment: Defer to PT evaluation   Cervical / Trunk Assessment Cervical / Trunk Assessment: Normal   Communication Communication Communication: No difficulties   Cognition Arousal/Alertness: Awake/alert Behavior During Therapy: Anxious Overall Cognitive Status: Within Functional Limits for tasks assessed                     General Comments       Exercises       Shoulder Instructions      Home Living Family/patient expects to be discharged to:: Private residence Living Arrangements: Spouse/significant other Available Help at Discharge: Family;Available 24 hours/day Type of Home: House Home Access: Stairs to enter [employer] of Steps: 4-5 Entrance Stairs-Rails: Right Home Layout: Multi-level Alternate Level Stairs-Number of Steps: Mutiple stiars to different level (9 max) Alternate Level Stairs-Rails: Right Bathroom Shower/Tub: Occupational psychologist: Handicapped height Bathroom Accessibility: Yes How Accessible: Accessible via walker Home Equipment: Shower seat - built in   Additional Comments: Pt reports a 3in1 and a RW has been delivered to her home       Prior Functioning/Environment Level of Independence: Independent             OT Diagnosis: Generalized weakness;Acute pain   OT Problem List: Decreased strength;Decreased knowledge of use of DME or AE;Pain   OT Treatment/Interventions:      OT Goals(Current goals can be found in the care plan section) Acute Rehab OT Goals Patient  Stated Goal: To go home (spouse doesn't feel like she is ready to discharge.   OT Frequency:     Barriers to D/C:            Co-evaluation              End of Session Equipment Utilized During Treatment: Rolling walker;Left knee immobilizer CPM Left Knee CPM Left Knee: Off Nurse Communication: Mobility status;Other (comment) (see general comments )  Activity Tolerance: Patient tolerated treatment well Patient left: in chair;with call bell/phone within reach;with family/visitor present   Time: 4765-4650 OT Time Calculation (min): 54 min Charges:  OT General Charges $OT Visit: 1 Procedure OT Evaluation $Initial OT Evaluation Tier I: 1 Procedure OT Treatments $Self Care/Home Management : 23-37 mins G-Codes:    Houston Zapien M 09-02-14, 11:51 AM

## 2014-08-20 NOTE — Discharge Summary (Signed)
Patient ID: April Franco MRN: [id number] DOB/AGE: 1940/12/31 73 y.o.  Admit date: 08/16/2014 Discharge date: 08/19/2014  Admission Diagnoses:  Principal Problem:   Primary osteoarthritis of left knee   Discharge Diagnoses:  Same  Past Medical History  Diagnosis Date  . Hypertension   . Hypothyroidism   . Hyperlipemia   . Depression     Pts daughter died from renal cancer at age 58 in 2013  . Insomnia     Takes Ambien if needed  . Complication of anesthesia     Pt stated "it takes alot to put me to sleep and alot to numb me at the dentist"  . Pneumonia 1976; 1997; ~ 2005  . Type II diabetes mellitus   . Anemia   . Headache     "probably weekly" (08/17/2014)  . Febrile seizure     "as a child"  . Arthritis     "left knee; right hand; right ankle" (08/17/2014)  . Anxiety     Pts daughter died from renal cancer at age 9 in 2013    Surgeries: Procedure(s): TOTAL KNEE ARTHROPLASTY on 08/16/2014   Consultants:    Discharged Condition: Improved  Hospital Course: KENSLEY VALLADARES is an 73 y.o. female who was admitted 08/16/2014 for operative treatment ofPrimary osteoarthritis of left knee. Patient has severe unremitting pain that affects sleep, daily activities, and work/hobbies. After pre-op clearance the patient was taken to the operating room on 08/16/2014 and underwent  Procedure(s): TOTAL KNEE ARTHROPLASTY.    Patient was given perioperative antibiotics: Anti-infectives    Start     Dose/Rate Route Frequency Ordered Stop   08/16/14 2000  ceFAZolin (ANCEF) IVPB 2 g/50 mL premix     2 g100 mL/hr over 30 Minutes Intravenous Every 6 hours 08/16/14 1715 08/17/14 0204   08/16/14 0600  ceFAZolin (ANCEF) IVPB 2 g/50 mL premix     2 g100 mL/hr over 30 Minutes Intravenous On call to O.R. 08/15/14 1336 08/16/14 1308       Patient was given sequential compression devices, early ambulation, and ASA 325mg  BID to prevent DVT.  Patient benefited maximally from hospital stay  and there were no complications.  She was kept for 3 days post op for PT/OT and pain control.   Recent vital signs: No data found.    Recent laboratory studies:  Recent Labs  08/18/14 0533 08/19/14 0500  WBC 8.1 8.1  HGB 8.8* 8.0*  HCT 27.7* 25.6*  PLT 279 277     Discharge Medications:     Medication List    STOP taking these medications        acetaminophen 325 MG tablet  Commonly known as:  TYLENOL     HYDROcodone-acetaminophen 5-325 MG per tablet  Commonly known as:  NORCO/VICODIN      TAKE these medications        alendronate 70 MG tablet  Commonly known as:  FOSAMAX  Take 70 mg by mouth once a week. Take with a full glass of water on an empty stomach.     ALPRAZolam 0.25 MG tablet  Commonly known as:  XANAX  Take 0.25 mg by mouth 2 (two) times daily as needed for anxiety.     amLODipine 10 MG tablet  Commonly known as:  NORVASC  Take 10 mg by mouth daily.     aspirin EC 325 MG tablet  Take 1 tablet (325 mg total) by mouth 2 (two) times daily after a meal.  CALTRATE 600+D PLUS PO  Take 1 tablet by mouth daily.     escitalopram 20 MG tablet  Commonly known as:  LEXAPRO  Take 20 mg by mouth daily.     glimepiride 2 MG tablet  Commonly known as:  AMARYL  Take 2 mg by mouth daily with breakfast.     hydrochlorothiazide 25 MG tablet  Commonly known as:  HYDRODIURIL  Take 25 mg by mouth every other day.     hydrOXYzine 25 MG tablet  Commonly known as:  ATARAX/VISTARIL  Take 25 mg by mouth 3 (three) times daily as needed for anxiety.     levothyroxine 50 MCG tablet  Commonly known as:  SYNTHROID, LEVOTHROID  Take 50 mcg by mouth daily before breakfast.     lisinopril 20 MG tablet  Commonly known as:  PRINIVIL,ZESTRIL  Take 20 mg by mouth daily.     metFORMIN 500 MG tablet  Commonly known as:  GLUCOPHAGE  Take 1,000 mg by mouth 2 (two) times daily with a meal.     methocarbamol 500 MG tablet  Commonly known as:  ROBAXIN  Take 1 tablet  (500 mg total) by mouth every 8 (eight) hours as needed for muscle spasms.     MULTIVITAMIN PO  Take 1 tablet by mouth daily.     oxyCODONE-acetaminophen 5-325 MG per tablet  Commonly known as:  PERCOCET/ROXICET  Take 1-2 tablets by mouth every 6 (six) hours as needed for severe pain.     pravastatin 40 MG tablet  Commonly known as:  PRAVACHOL  Take 40 mg by mouth daily.     traMADol 50 MG tablet  Commonly known as:  ULTRAM  Take 50 mg by mouth every 6 (six) hours as needed (pain).     zolpidem 10 MG tablet  Commonly known as:  AMBIEN  Take 10 mg by mouth at bedtime as needed for sleep.        Diagnostic Studies: Dg Chest 2 View  08/07/2014   CLINICAL DATA:  Preop total knee arthroplasty  EXAM: CHEST  2 VIEW  COMPARISON:  None.  FINDINGS: The heart size and mediastinal contours are within normal limits. Both lungs are clear. There is mild thoracic spine spondylosis.  IMPRESSION: No active cardiopulmonary disease.   Electronically Signed   By: Kathreen Devoid   On: 08/07/2014 12:58    Disposition: 01-Home or Self Care      Discharge Instructions    Call MD / Call 911    Complete by:  As directed   If you experience chest pain or shortness of breath, CALL 911 and be transported to the hospital emergency room.  If you develope a fever above 101 F, pus (white drainage) or increased drainage or redness at the wound, or calf pain, call your surgeon's office.     Constipation Prevention    Complete by:  As directed   Drink plenty of fluids.  Prune juice may be helpful.  You may use a stool softener, such as Colace (over the counter) 100 mg twice a day.  Use MiraLax (over the counter) for constipation as needed.     Diet - low sodium heart healthy    Complete by:  As directed      Increase activity slowly as tolerated    Complete by:  As directed      Weight bearing as tolerated    Complete by:  As directed   Laterality:  left  Extremity:  Lower  Follow-up  Information    Follow up with GRAVES,JOHN L, MD. Schedule an appointment as soon as possible for a visit in 2 weeks.   Specialty:  Orthopedic Surgery   Contact information:   [place] [address] [phone]       Follow up with [facility].   Why:  They will contact you to schedule home physical therapy visits.   Contact information:   [address] [place] [address] [phone]        Signed: Grier Mitts 08/20/2014, 9:38 PM

## 2014-11-22 ENCOUNTER — Other Ambulatory Visit: Payer: Self-pay | Admitting: Family Medicine

## 2014-11-22 DIAGNOSIS — M810 Age-related osteoporosis without current pathological fracture: Secondary | ICD-10-CM

## 2016-01-11 ENCOUNTER — Other Ambulatory Visit: Payer: Self-pay | Admitting: Family Medicine

## 2016-01-11 DIAGNOSIS — M810 Age-related osteoporosis without current pathological fracture: Secondary | ICD-10-CM

## 2016-01-11 DIAGNOSIS — Z1231 Encounter for screening mammogram for malignant neoplasm of breast: Secondary | ICD-10-CM

## 2016-01-22 ENCOUNTER — Ambulatory Visit: Payer: Self-pay | Admitting: General Surgery

## 2016-01-30 ENCOUNTER — Ambulatory Visit
Admission: RE | Admit: 2016-01-30 | Discharge: 2016-01-30 | Disposition: A | Discharge status: Home / Self Care | Payer: Medicare Other | Source: Ambulatory Visit | Attending: Family Medicine | Admitting: Family Medicine

## 2016-01-30 DIAGNOSIS — Z1231 Encounter for screening mammogram for malignant neoplasm of breast: Secondary | ICD-10-CM

## 2016-01-30 DIAGNOSIS — M810 Age-related osteoporosis without current pathological fracture: Secondary | ICD-10-CM

## 2016-02-21 ENCOUNTER — Encounter (HOSPITAL_COMMUNITY): Payer: Self-pay | Admitting: *Deleted

## 2016-02-21 ENCOUNTER — Encounter (HOSPITAL_COMMUNITY): Payer: Self-pay

## 2016-02-21 ENCOUNTER — Encounter (HOSPITAL_COMMUNITY)
Admission: RE | Admit: 2016-02-21 | Discharge: 2016-02-21 | Disposition: A | Discharge status: Home / Self Care | Payer: Medicare Other | Source: Ambulatory Visit | Attending: General Surgery | Admitting: General Surgery

## 2016-02-21 DIAGNOSIS — Z0183 Encounter for blood typing: Secondary | ICD-10-CM | POA: Insufficient documentation

## 2016-02-21 DIAGNOSIS — R001 Bradycardia, unspecified: Secondary | ICD-10-CM | POA: Insufficient documentation

## 2016-02-21 DIAGNOSIS — Z01818 Encounter for other preprocedural examination: Secondary | ICD-10-CM | POA: Insufficient documentation

## 2016-02-21 DIAGNOSIS — K432 Incisional hernia without obstruction or gangrene: Secondary | ICD-10-CM | POA: Insufficient documentation

## 2016-02-21 DIAGNOSIS — Z01812 Encounter for preprocedural laboratory examination: Secondary | ICD-10-CM | POA: Insufficient documentation

## 2016-02-21 HISTORY — DX: Family history of other specified conditions: Z84.89

## 2016-02-21 LAB — CBC WITH DIFFERENTIAL/PLATELET
BASOS ABS: 0.1 10*3/uL (ref 0.0–0.1)
BASOS PCT: 1 %
EOS ABS: 0.4 10*3/uL (ref 0.0–0.7)
Eosinophils Relative: 5 %
HEMATOCRIT: 38.8 % (ref 36.0–46.0)
HEMOGLOBIN: 12.2 g/dL (ref 12.0–15.0)
Lymphocytes Relative: 26 %
Lymphs Abs: 2.1 10*3/uL (ref 0.7–4.0)
MCH: 27.9 pg (ref 26.0–34.0)
MCHC: 31.4 g/dL (ref 30.0–36.0)
MCV: 88.8 fL (ref 78.0–100.0)
Monocytes Absolute: 0.8 10*3/uL (ref 0.1–1.0)
Monocytes Relative: 10 %
NEUTROS ABS: 4.7 10*3/uL (ref 1.7–7.7)
NEUTROS PCT: 58 %
Platelets: 386 10*3/uL (ref 150–400)
RBC: 4.37 MIL/uL (ref 3.87–5.11)
RDW: 15 % (ref 11.5–15.5)
WBC: 8.1 10*3/uL (ref 4.0–10.5)

## 2016-02-21 LAB — BASIC METABOLIC PANEL
ANION GAP: 9 (ref 5–15)
BUN: 10 mg/dL (ref 6–20)
CALCIUM: 10.9 mg/dL — AB (ref 8.9–10.3)
CHLORIDE: 97 mmol/L — AB (ref 101–111)
CO2: 29 mmol/L (ref 22–32)
CREATININE: 1.01 mg/dL — AB (ref 0.44–1.00)
GFR calc non Af Amer: 54 mL/min — ABNORMAL LOW (ref >60)
Glucose, Bld: 132 mg/dL — ABNORMAL HIGH (ref 65–99)
Potassium: 3.8 mmol/L (ref 3.5–5.1)
SODIUM: 135 mmol/L (ref 135–145)

## 2016-02-21 LAB — TYPE AND SCREEN
ABO/RH(D): O NEG
ANTIBODY SCREEN: NEGATIVE
UNIT DIVISION: 0
Unit division: 0

## 2016-02-21 LAB — GLUCOSE, CAPILLARY: GLUCOSE-CAPILLARY: 121 mg/dL — AB (ref 65–99)

## 2016-02-21 NOTE — Pre-Procedure Instructions (Addendum)
April Franco  02/21/2016    Your procedure is scheduled on Thursday, June 6.  Report to [facility] Admitting at 5:30 A.M.               Your surgery or procedure is scheduled for 7:30 AM   Call this number if you have problems the morning of surgery:[phone]                  For any other questions, please call [phone], Monday - Friday 8 AM - 4 PM.   Remember:  Do not eat food or drink liquids after midnight Wednesday, July 5.  Take these medicines the morning of surgery with A SIP OF WATER: amLODipine (NORVASC), escitalopram (LEXAPRO), levothyroxine (SYNTHROID, LEVOTHROID).                 Take if needed: Tramadol (Ultram), Alprazolam (Xanax).   Stop taking Aspirin, Coumadin, Plavix, Effient and Herbal medications.  Do not take any NSAIDs ie: Ibuprofen,  Advil,Naproxen or any medication containing Aspirin.          Do not take oral diabetes medicines (pills) the morning of surgery.  How to Manage Your Diabetes Before and After Surgery  Why is it important to control my blood sugar before and after surgery? . Improving blood sugar levels before and after surgery helps healing and can limit problems. . A way of improving blood sugar control is eating a healthy diet by: o  Eating less sugar and carbohydrates o  Increasing activity/exercise o  Talking with your doctor about reaching your blood sugar goals . High blood sugars (greater than 180 mg/dL) can raise your risk of infections and slow your recovery, so you will need to focus on controlling your diabetes during the weeks before surgery. . Make sure that the doctor who takes care of your diabetes knows about your planned surgery including the date and location.  How do I manage my blood sugar before surgery? . Check your blood sugar at least 4 times a day, starting 2 days before surgery, to make sure that the level is not too high or low. o Check your blood sugar the morning of your surgery when you wake  up and every 2 hours until you get to the Short Stay unit. . If your blood sugar is less than 70 mg/dL, you will need to treat for low blood sugar: o Do not take insulin. o Treat a low blood sugar (less than 70 mg/dL) with  cup of clear juice (cranberry or apple), 4 glucose tablets, OR glucose gel. o Recheck blood sugar in 15 minutes after treatment (to make sure it is greater than 70 mg/dL). If your blood sugar is not greater than 70 mg/dL on recheck, call [phone] for further instructions. . Report your blood sugar to the short stay nurse when you get to Short Stay.  . If you are admitted to the hospital after surgery: o Your blood sugar will be checked by the staff and you will probably be given insulin after surgery (instead of oral diabetes medicines) to make sure you have good blood sugar levels.          The goal for blood sugar control after surgery is 80-180 mg/dL.      WHAT DO I DO ABOUT MY DIABETES MEDICATION?   Marland Kitchen Do not take oral diabetes medicines (pills) the morning of surgery.  . The day of surgery, do not take other diabetes  injectables, including Byetta (exenatide), Bydureon (exenatide ER), Victoza (liraglutide), or Trulicity (dulaglutide)    Do not wear jewelry, make-up or nail polish.  Do not wear lotions, powders, or perfumes.  Do not shave 48 hours prior to surgery.    Do not bring valuables to the hospital.  [facility] is not responsible for any belongings or valuables.  Contacts, dentures or bridgework may not be worn into surgery.  Leave your suitcase in the car.  After surgery it may be brought to your room.  For patients admitted to the hospital, discharge time will be determined by your treatment team.  Patients discharged the day of surgery will not be allowed to drive home.   Name and phone number of your driver:     Special instructions:  [place]- Preparing For Surgery    Please read over the following fact sheets that you were  given. [place]- Preparing For Surgery

## 2016-02-21 NOTE — Pre-Procedure Instructions (Signed)
    KLARKE KENEALY  02/21/2016    Your procedure is scheduled on Thursday, June 6.  Report to [facility] Admitting at 5:30 A.M.               Your surgery or procedure is scheduled for 7:30 AM   Call this number if you have problems the morning of surgery:[phone]                  For any other questions, please call [phone], Monday - Friday 8 AM - 4 PM.   Remember:  Do not eat food or drink liquids after midnight Wednesday, July 5.  Take these medicines the morning of surgery with A SIP OF WATER: amLODipine (NORVASC), escitalopram (LEXAPRO), levothyroxine (SYNTHROID, LEVOTHROID).                 Take if needed: Tramadol (Ultram).   Do not wear jewelry, make-up or nail polish.  Do not wear lotions, powders, or perfumes.  Do not shave 48 hours prior to surgery.    Do not bring valuables to the hospital.  [facility] is not responsible for any belongings or valuables.  Contacts, dentures or bridgework may not be worn into surgery.  Leave your suitcase in the car.  After surgery it may be brought to your room.  For patients admitted to the hospital, discharge time will be determined by your treatment team.  Patients discharged the day of surgery will not be allowed to drive home.   Name and phone number of your driver:     Special instructions:  [place]- Preparing For Surgery    Please read over the following fact sheets that you were given. - Preparing For Surgery

## 2016-02-21 NOTE — Progress Notes (Signed)
Mrs April Franco denies chest pain or shortness of breath.  PCP is Dr Rex Kras, patient states that A1C drawn in May was 7.8.

## 2016-02-22 ENCOUNTER — Other Ambulatory Visit: Payer: Self-pay | Admitting: Gastroenterology

## 2016-02-27 MED ORDER — CEFAZOLIN SODIUM-DEXTROSE 2-4 GM/100ML-% IV SOLN
2.0000 g | INTRAVENOUS | Status: AC
  Administered 2016-02-28: 2 g via INTRAVENOUS
  Filled 2016-02-27: qty 100

## 2016-02-28 ENCOUNTER — Ambulatory Visit (HOSPITAL_COMMUNITY): Payer: Medicare Other

## 2016-02-28 ENCOUNTER — Encounter (HOSPITAL_COMMUNITY): Payer: Self-pay | Admitting: General Practice

## 2016-02-28 ENCOUNTER — Ambulatory Visit (HOSPITAL_COMMUNITY): Payer: Medicare Other | Admitting: Anesthesiology

## 2016-02-28 ENCOUNTER — Inpatient Hospital Stay (HOSPITAL_COMMUNITY)
Admission: AD | Admit: 2016-02-28 | Discharge: 2016-03-04 | DRG: 355 | Disposition: A | Discharge status: Home Health | Payer: Medicare Other | Source: Ambulatory Visit | Attending: General Surgery | Admitting: General Surgery

## 2016-02-28 ENCOUNTER — Encounter (HOSPITAL_COMMUNITY)
Admission: AD | Disposition: A | Discharge status: Home Health | Payer: Self-pay | Source: Ambulatory Visit | Attending: General Surgery

## 2016-02-28 DIAGNOSIS — R0602 Shortness of breath: Secondary | ICD-10-CM | POA: Diagnosis not present

## 2016-02-28 DIAGNOSIS — Z96652 Presence of left artificial knee joint: Secondary | ICD-10-CM | POA: Diagnosis present

## 2016-02-28 DIAGNOSIS — F419 Anxiety disorder, unspecified: Secondary | ICD-10-CM | POA: Diagnosis present

## 2016-02-28 DIAGNOSIS — E039 Hypothyroidism, unspecified: Secondary | ICD-10-CM | POA: Diagnosis present

## 2016-02-28 DIAGNOSIS — K567 Ileus, unspecified: Secondary | ICD-10-CM

## 2016-02-28 DIAGNOSIS — K219 Gastro-esophageal reflux disease without esophagitis: Secondary | ICD-10-CM | POA: Diagnosis present

## 2016-02-28 DIAGNOSIS — E559 Vitamin D deficiency, unspecified: Secondary | ICD-10-CM | POA: Diagnosis present

## 2016-02-28 DIAGNOSIS — K432 Incisional hernia without obstruction or gangrene: Secondary | ICD-10-CM | POA: Diagnosis present

## 2016-02-28 DIAGNOSIS — I129 Hypertensive chronic kidney disease with stage 1 through stage 4 chronic kidney disease, or unspecified chronic kidney disease: Secondary | ICD-10-CM | POA: Diagnosis present

## 2016-02-28 DIAGNOSIS — N189 Chronic kidney disease, unspecified: Secondary | ICD-10-CM | POA: Diagnosis present

## 2016-02-28 DIAGNOSIS — R0902 Hypoxemia: Secondary | ICD-10-CM

## 2016-02-28 DIAGNOSIS — F329 Major depressive disorder, single episode, unspecified: Secondary | ICD-10-CM | POA: Diagnosis present

## 2016-02-28 DIAGNOSIS — R339 Retention of urine, unspecified: Secondary | ICD-10-CM | POA: Diagnosis present

## 2016-02-28 DIAGNOSIS — Z888 Allergy status to other drugs, medicaments and biological substances status: Secondary | ICD-10-CM

## 2016-02-28 DIAGNOSIS — K430 Incisional hernia with obstruction, without gangrene: Principal | ICD-10-CM | POA: Diagnosis present

## 2016-02-28 DIAGNOSIS — E785 Hyperlipidemia, unspecified: Secondary | ICD-10-CM | POA: Diagnosis present

## 2016-02-28 DIAGNOSIS — K9189 Other postprocedural complications and disorders of digestive system: Secondary | ICD-10-CM

## 2016-02-28 DIAGNOSIS — Z09 Encounter for follow-up examination after completed treatment for conditions other than malignant neoplasm: Secondary | ICD-10-CM

## 2016-02-28 DIAGNOSIS — E1122 Type 2 diabetes mellitus with diabetic chronic kidney disease: Secondary | ICD-10-CM | POA: Diagnosis present

## 2016-02-28 DIAGNOSIS — K560 Paralytic ileus: Secondary | ICD-10-CM | POA: Diagnosis present

## 2016-02-28 DIAGNOSIS — M810 Age-related osteoporosis without current pathological fracture: Secondary | ICD-10-CM | POA: Diagnosis present

## 2016-02-28 HISTORY — DX: Zoster without complications: B02.9

## 2016-02-28 HISTORY — PX: INSERTION OF MESH: SHX5868

## 2016-02-28 HISTORY — DX: Vitamin D deficiency, unspecified: E55.9

## 2016-02-28 HISTORY — DX: Age-related osteoporosis without current pathological fracture: M81.0

## 2016-02-28 HISTORY — PX: INCISIONAL HERNIA REPAIR: SHX193

## 2016-02-28 HISTORY — DX: Gastro-esophageal reflux disease without esophagitis: K21.9

## 2016-02-28 HISTORY — DX: Chronic kidney disease, unspecified: N18.9

## 2016-02-28 HISTORY — DX: Unspecified intracranial injury with loss of consciousness of 30 minutes or less, initial encounter: S06.9X1A

## 2016-02-28 HISTORY — DX: Unspecified intracranial injury with loss of consciousness of unspecified duration, initial encounter: S06.9X9A

## 2016-02-28 LAB — GLUCOSE, CAPILLARY
GLUCOSE-CAPILLARY: 222 mg/dL — AB (ref 65–99)
GLUCOSE-CAPILLARY: 150 mg/dL — AB (ref 65–99)
Glucose-Capillary: 172 mg/dL — ABNORMAL HIGH (ref 65–99)

## 2016-02-28 SURGERY — REPAIR, HERNIA, INCISIONAL, LAPAROSCOPIC
Anesthesia: General | Site: Abdomen

## 2016-02-28 MED ORDER — ONDANSETRON HCL 4 MG/2ML IJ SOLN
4.0000 mg | Freq: Four times a day (QID) | INTRAMUSCULAR | Status: DC | PRN
  Administered 2016-02-28 – 2016-03-04 (×6): 4 mg via INTRAVENOUS
  Filled 2016-02-28 (×7): qty 2

## 2016-02-28 MED ORDER — TRAMADOL HCL 50 MG PO TABS
50.0000 mg | ORAL_TABLET | Freq: Four times a day (QID) | ORAL | Status: DC | PRN
  Administered 2016-02-28: 50 mg via ORAL

## 2016-02-28 MED ORDER — KETOROLAC TROMETHAMINE 15 MG/ML IJ SOLN
15.0000 mg | Freq: Once | INTRAMUSCULAR | Status: AC
  Administered 2016-02-28: 15 mg via INTRAVENOUS

## 2016-02-28 MED ORDER — ONDANSETRON 4 MG PO TBDP: 4.0000 mg | ORAL_TABLET | Freq: Four times a day (QID) | ORAL | Status: DC | PRN

## 2016-02-28 MED ORDER — TRAMADOL HCL 50 MG PO TABS
ORAL_TABLET | ORAL | Status: AC
  Administered 2016-02-28: 50 mg via ORAL
  Filled 2016-02-28: qty 1

## 2016-02-28 MED ORDER — MIDAZOLAM HCL 2 MG/2ML IJ SOLN
INTRAMUSCULAR | Status: AC
  Filled 2016-02-28: qty 2

## 2016-02-28 MED ORDER — LISINOPRIL 20 MG PO TABS
20.0000 mg | ORAL_TABLET | Freq: Every day | ORAL | Status: DC
  Administered 2016-02-28 – 2016-03-01 (×3): 20 mg via ORAL
  Filled 2016-02-28 (×3): qty 1

## 2016-02-28 MED ORDER — METFORMIN HCL 500 MG PO TABS
1000.0000 mg | ORAL_TABLET | Freq: Two times a day (BID) | ORAL | Status: DC
  Administered 2016-02-28 – 2016-03-01 (×4): 1000 mg via ORAL
  Filled 2016-02-28 (×5): qty 2

## 2016-02-28 MED ORDER — ALBUTEROL SULFATE (2.5 MG/3ML) 0.083% IN NEBU
INHALATION_SOLUTION | RESPIRATORY_TRACT | Status: AC
  Administered 2016-02-28: 2.5 mg via RESPIRATORY_TRACT
  Filled 2016-02-28: qty 3

## 2016-02-28 MED ORDER — POLYETHYLENE GLYCOL 3350 17 G PO PACK: 17.0000 g | PACK | Freq: Every day | ORAL | Status: DC | PRN

## 2016-02-28 MED ORDER — ARTIFICIAL TEARS OP OINT
TOPICAL_OINTMENT | OPHTHALMIC | Status: AC
  Filled 2016-02-28: qty 3.5

## 2016-02-28 MED ORDER — KETOROLAC TROMETHAMINE 15 MG/ML IJ SOLN
INTRAMUSCULAR | Status: AC
Start: 2016-02-28 — End: 2016-02-28
  Administered 2016-02-28: 15 mg via INTRAVENOUS
  Filled 2016-02-28: qty 1

## 2016-02-28 MED ORDER — LIDOCAINE 2% (20 MG/ML) 5 ML SYRINGE
INTRAMUSCULAR | Status: AC
  Filled 2016-02-28: qty 5

## 2016-02-28 MED ORDER — VITAMIN D (ERGOCALCIFEROL) 1.25 MG (50000 UNIT) PO CAPS
50000.0000 [IU] | ORAL_CAPSULE | ORAL | Status: DC
  Administered 2016-03-02: 50000 [IU] via ORAL
  Filled 2016-02-28: qty 1

## 2016-02-28 MED ORDER — PHENOL 1.4 % MT LIQD
1.0000 | OROMUCOSAL | Status: DC | PRN
  Administered 2016-02-28: 1 via OROMUCOSAL
  Filled 2016-02-28: qty 177

## 2016-02-28 MED ORDER — LACTATED RINGERS IV SOLN
INTRAVENOUS | Status: DC | PRN
  Administered 2016-02-28 (×2): via INTRAVENOUS

## 2016-02-28 MED ORDER — HYDROMORPHONE HCL 1 MG/ML IJ SOLN
0.5000 mg | INTRAMUSCULAR | Status: DC | PRN
  Administered 2016-02-28 – 2016-03-01 (×7): 1 mg via INTRAVENOUS
  Filled 2016-02-28 (×7): qty 1

## 2016-02-28 MED ORDER — OXYCODONE HCL 5 MG/5ML PO SOLN: 5.0000 mg | Freq: Once | ORAL | Status: DC | PRN

## 2016-02-28 MED ORDER — PHENYLEPHRINE HCL 10 MG/ML IJ SOLN
INTRAMUSCULAR | Status: DC | PRN
  Administered 2016-02-28: 80 ug via INTRAVENOUS
  Administered 2016-02-28 (×2): 40 ug via INTRAVENOUS

## 2016-02-28 MED ORDER — GLIMEPIRIDE 1 MG PO TABS
1.0000 mg | ORAL_TABLET | Freq: Every day | ORAL | Status: DC
  Administered 2016-02-29 – 2016-03-04 (×5): 1 mg via ORAL
  Filled 2016-02-28 (×6): qty 1

## 2016-02-28 MED ORDER — AMLODIPINE BESYLATE 10 MG PO TABS
10.0000 mg | ORAL_TABLET | Freq: Every day | ORAL | Status: DC
  Administered 2016-02-29 – 2016-03-04 (×5): 10 mg via ORAL
  Filled 2016-02-28 (×5): qty 1

## 2016-02-28 MED ORDER — LEVOTHYROXINE SODIUM 50 MCG PO TABS
50.0000 ug | ORAL_TABLET | Freq: Every day | ORAL | Status: DC
  Administered 2016-02-29 – 2016-03-04 (×5): 50 ug via ORAL
  Filled 2016-02-28 (×5): qty 1

## 2016-02-28 MED ORDER — ESCITALOPRAM OXALATE 20 MG PO TABS
20.0000 mg | ORAL_TABLET | Freq: Every day | ORAL | Status: DC
  Administered 2016-02-29 – 2016-03-04 (×5): 20 mg via ORAL
  Filled 2016-02-28 (×5): qty 1

## 2016-02-28 MED ORDER — METHOCARBAMOL 500 MG PO TABS
ORAL_TABLET | ORAL | Status: AC
  Filled 2016-02-28: qty 1

## 2016-02-28 MED ORDER — MIDAZOLAM HCL 5 MG/5ML IJ SOLN
INTRAMUSCULAR | Status: DC | PRN
  Administered 2016-02-28: 2 mg via INTRAVENOUS

## 2016-02-28 MED ORDER — FENTANYL CITRATE (PF) 100 MCG/2ML IJ SOLN
INTRAMUSCULAR | Status: AC
  Administered 2016-02-28: 50 ug via INTRAVENOUS
  Filled 2016-02-28: qty 2

## 2016-02-28 MED ORDER — SODIUM CHLORIDE 0.9 % IV SOLN
INTRAVENOUS | Status: DC
  Administered 2016-02-28 – 2016-03-03 (×6): via INTRAVENOUS

## 2016-02-28 MED ORDER — ONDANSETRON HCL 4 MG/2ML IJ SOLN
INTRAMUSCULAR | Status: DC | PRN
  Administered 2016-02-28: 4 mg via INTRAVENOUS

## 2016-02-28 MED ORDER — DEXTROSE 5 % IV SOLN
10.0000 mg | INTRAVENOUS | Status: DC | PRN
  Administered 2016-02-28: 30 ug/min via INTRAVENOUS

## 2016-02-28 MED ORDER — INSULIN ASPART 100 UNIT/ML ~~LOC~~ SOLN
0.0000 [IU] | Freq: Three times a day (TID) | SUBCUTANEOUS | Status: DC
  Administered 2016-02-28: 3 [IU] via SUBCUTANEOUS

## 2016-02-28 MED ORDER — FENTANYL CITRATE (PF) 100 MCG/2ML IJ SOLN
INTRAMUSCULAR | Status: DC | PRN
  Administered 2016-02-28 (×5): 50 ug via INTRAVENOUS

## 2016-02-28 MED ORDER — FENTANYL CITRATE (PF) 250 MCG/5ML IJ SOLN
INTRAMUSCULAR | Status: AC
  Filled 2016-02-28: qty 5

## 2016-02-28 MED ORDER — HYDROMORPHONE HCL 1 MG/ML IJ SOLN
INTRAMUSCULAR | Status: AC
  Filled 2016-02-28: qty 1

## 2016-02-28 MED ORDER — BUPIVACAINE-EPINEPHRINE 0.25% -1:200000 IJ SOLN
INTRAMUSCULAR | Status: DC | PRN
  Administered 2016-02-28: 20 mL

## 2016-02-28 MED ORDER — ROCURONIUM BROMIDE 50 MG/5ML IV SOLN
INTRAVENOUS | Status: AC
  Filled 2016-02-28: qty 1

## 2016-02-28 MED ORDER — HYDROCHLOROTHIAZIDE 25 MG PO TABS
25.0000 mg | ORAL_TABLET | ORAL | Status: DC
Start: 2016-02-29 — End: 2016-03-04
  Administered 2016-03-02 – 2016-03-04 (×2): 25 mg via ORAL
  Filled 2016-02-28 (×3): qty 1

## 2016-02-28 MED ORDER — OXYCODONE HCL 5 MG PO TABS: 5.0000 mg | ORAL_TABLET | Freq: Once | ORAL | Status: DC | PRN

## 2016-02-28 MED ORDER — HYDROMORPHONE HCL 1 MG/ML IJ SOLN
INTRAMUSCULAR | Status: DC | PRN
  Administered 2016-02-28: 0.5 mg via INTRAVENOUS

## 2016-02-28 MED ORDER — SODIUM CHLORIDE 0.9 % IR SOLN
Status: DC | PRN
  Administered 2016-02-28: 1000 mL

## 2016-02-28 MED ORDER — ALPRAZOLAM 0.25 MG PO TABS
0.2500 mg | ORAL_TABLET | Freq: Two times a day (BID) | ORAL | Status: DC | PRN
  Administered 2016-03-02 – 2016-03-04 (×4): 0.25 mg via ORAL
  Filled 2016-02-28 (×4): qty 1

## 2016-02-28 MED ORDER — ACETAMINOPHEN 325 MG PO TABS: 650.0000 mg | ORAL_TABLET | Freq: Four times a day (QID) | ORAL | Status: DC | PRN

## 2016-02-28 MED ORDER — ACETAMINOPHEN 650 MG RE SUPP: 650.0000 mg | Freq: Four times a day (QID) | RECTAL | Status: DC | PRN

## 2016-02-28 MED ORDER — LIDOCAINE HCL (CARDIAC) 20 MG/ML IV SOLN
INTRAVENOUS | Status: DC | PRN
  Administered 2016-02-28: 30 mg via INTRAVENOUS

## 2016-02-28 MED ORDER — SUGAMMADEX SODIUM 200 MG/2ML IV SOLN
INTRAVENOUS | Status: DC | PRN
  Administered 2016-02-28: 200 mg via INTRAVENOUS

## 2016-02-28 MED ORDER — METHOCARBAMOL 500 MG PO TABS
500.0000 mg | ORAL_TABLET | Freq: Four times a day (QID) | ORAL | Status: DC | PRN
  Administered 2016-02-28 – 2016-02-29 (×2): 500 mg via ORAL
  Filled 2016-02-28: qty 1

## 2016-02-28 MED ORDER — GLYCOPYRROLATE 0.2 MG/ML IJ SOLN
INTRAMUSCULAR | Status: DC | PRN
  Administered 2016-02-28 (×2): 0.2 mg via INTRAVENOUS

## 2016-02-28 MED ORDER — POLYMYXIN B SULFATE 500000 UNITS IJ SOLR
INTRAMUSCULAR | Status: DC | PRN
  Administered 2016-02-28: 500 mL

## 2016-02-28 MED ORDER — FENTANYL CITRATE (PF) 100 MCG/2ML IJ SOLN
25.0000 ug | INTRAMUSCULAR | Status: DC | PRN
  Administered 2016-02-28: 50 ug via INTRAVENOUS
  Administered 2016-02-28: 25 ug via INTRAVENOUS
  Administered 2016-02-28: 50 ug via INTRAVENOUS
  Administered 2016-02-28: 25 ug via INTRAVENOUS

## 2016-02-28 MED ORDER — ONDANSETRON HCL 4 MG/2ML IJ SOLN
INTRAMUSCULAR | Status: AC
  Filled 2016-02-28: qty 2

## 2016-02-28 MED ORDER — ZOLPIDEM TARTRATE 5 MG PO TABS
5.0000 mg | ORAL_TABLET | Freq: Every evening | ORAL | Status: DC | PRN
  Administered 2016-02-29 – 2016-03-03 (×4): 5 mg via ORAL
  Filled 2016-02-28 (×4): qty 1

## 2016-02-28 MED ORDER — ROCURONIUM BROMIDE 100 MG/10ML IV SOLN
INTRAVENOUS | Status: DC | PRN
  Administered 2016-02-28: 50 mg via INTRAVENOUS
  Administered 2016-02-28: 10 mg via INTRAVENOUS
  Administered 2016-02-28: 40 mg via INTRAVENOUS

## 2016-02-28 MED ORDER — PROPOFOL 10 MG/ML IV BOLUS
INTRAVENOUS | Status: DC | PRN
  Administered 2016-02-28: 150 mg via INTRAVENOUS

## 2016-02-28 MED ORDER — ONDANSETRON HCL 4 MG/2ML IJ SOLN
INTRAMUSCULAR | Status: AC
  Administered 2016-02-28: 4 mg via INTRAVENOUS
  Filled 2016-02-28: qty 2

## 2016-02-28 MED ORDER — ALBUTEROL SULFATE (2.5 MG/3ML) 0.083% IN NEBU
2.5000 mg | INHALATION_SOLUTION | Freq: Four times a day (QID) | RESPIRATORY_TRACT | Status: DC | PRN
  Administered 2016-02-28: 2.5 mg via RESPIRATORY_TRACT

## 2016-02-28 MED ORDER — ENOXAPARIN SODIUM 40 MG/0.4ML ~~LOC~~ SOLN
40.0000 mg | SUBCUTANEOUS | Status: DC
  Administered 2016-02-29 – 2016-03-04 (×5): 40 mg via SUBCUTANEOUS
  Filled 2016-02-28 (×5): qty 0.4

## 2016-02-28 MED ORDER — PROPOFOL 10 MG/ML IV BOLUS
INTRAVENOUS | Status: AC
  Filled 2016-02-28: qty 40

## 2016-02-28 MED ORDER — ONDANSETRON HCL 4 MG/2ML IJ SOLN
4.0000 mg | Freq: Once | INTRAMUSCULAR | Status: AC | PRN
  Administered 2016-02-28: 4 mg via INTRAVENOUS

## 2016-02-28 MED ORDER — EPHEDRINE SULFATE 50 MG/ML IJ SOLN
INTRAMUSCULAR | Status: DC | PRN
  Administered 2016-02-28 (×4): 5 mg via INTRAVENOUS

## 2016-02-28 MED ORDER — BISACODYL 10 MG RE SUPP: 10.0000 mg | Freq: Every day | RECTAL | Status: DC | PRN

## 2016-02-28 MED ORDER — BUPIVACAINE-EPINEPHRINE (PF) 0.25% -1:200000 IJ SOLN
INTRAMUSCULAR | Status: AC
  Filled 2016-02-28: qty 30

## 2016-02-28 MED ORDER — OXYCODONE-ACETAMINOPHEN 5-325 MG PO TABS
1.0000 | ORAL_TABLET | ORAL | Status: DC | PRN
  Administered 2016-02-28 – 2016-02-29 (×3): 2 via ORAL
  Filled 2016-02-28 (×2): qty 2

## 2016-02-28 MED ORDER — OXYCODONE-ACETAMINOPHEN 5-325 MG PO TABS
ORAL_TABLET | ORAL | Status: AC
  Filled 2016-02-28: qty 2

## 2016-02-28 SURGICAL SUPPLY — 58 items
APPLIER CLIP 5 13 M/L LIGAMAX5 (MISCELLANEOUS)
APPLIER CLIP ROT 10 11.4 M/L (STAPLE)
APR CLP MED LRG 11.4X10 (STAPLE)
APR CLP MED LRG 5 ANG JAW (MISCELLANEOUS)
BAG DECANTER FOR FLEXI CONT (MISCELLANEOUS) ×3 IMPLANT
BINDER ABD UNIV 10 28-50 (GAUZE/BANDAGES/DRESSINGS) IMPLANT
BINDER ABD UNIV 12 45-62 (WOUND CARE) ×1 IMPLANT
BINDER ABDOM UNIV 10 (GAUZE/BANDAGES/DRESSINGS) ×3
BINDER ABDOMINAL 46IN 62IN (WOUND CARE)
BLADE SURG ROTATE 9660 (MISCELLANEOUS) ×2 IMPLANT
CANISTER SUCTION 2500CC (MISCELLANEOUS) IMPLANT
CHLORAPREP W/TINT 26ML (MISCELLANEOUS) ×3 IMPLANT
CLIP APPLIE 5 13 M/L LIGAMAX5 (MISCELLANEOUS) IMPLANT
CLIP APPLIE ROT 10 11.4 M/L (STAPLE) IMPLANT
CLOSURE WOUND 1/2 X4 (GAUZE/BANDAGES/DRESSINGS) ×1
DECANTER SPIKE VIAL GLASS SM (MISCELLANEOUS) ×3 IMPLANT
DEVICE SECURE STRAP 25 ABSORB (INSTRUMENTS) ×5 IMPLANT
DEVICE TROCAR PUNCTURE CLOSURE (ENDOMECHANICALS) ×3 IMPLANT
DRAPE LAPAROSCOPIC ABDOMINAL (DRAPES) ×3 IMPLANT
DRSG TEGADERM 2-3/8X2-3/4 SM (GAUZE/BANDAGES/DRESSINGS) ×2 IMPLANT
ELECT REM PT RETURN 9FT ADLT (ELECTROSURGICAL) ×3
ELECTRODE REM PT RTRN 9FT ADLT (ELECTROSURGICAL) ×1 IMPLANT
GLOVE BIO SURGEON STRL SZ 6.5 (GLOVE) ×1 IMPLANT
GLOVE BIO SURGEON STRL SZ7.5 (GLOVE) ×2 IMPLANT
GLOVE BIO SURGEONS STRL SZ 6.5 (GLOVE) ×1
GLOVE BIOGEL PI IND STRL 6.5 (GLOVE) IMPLANT
GLOVE BIOGEL PI IND STRL 7.5 (GLOVE) IMPLANT
GLOVE BIOGEL PI IND STRL 8 (GLOVE) ×1 IMPLANT
GLOVE BIOGEL PI INDICATOR 6.5 (GLOVE) ×2
GLOVE BIOGEL PI INDICATOR 7.5 (GLOVE) ×4
GLOVE BIOGEL PI INDICATOR 8 (GLOVE) ×2
GLOVE ECLIPSE 7.0 STRL STRAW (GLOVE) ×2 IMPLANT
GLOVE ECLIPSE 7.5 STRL STRAW (GLOVE) ×3 IMPLANT
GLOVE SURG SIGNA 7.5 PF LTX (GLOVE) ×2 IMPLANT
GOWN STRL REUS W/ TWL LRG LVL3 (GOWN DISPOSABLE) ×3 IMPLANT
GOWN STRL REUS W/TWL LRG LVL3 (GOWN DISPOSABLE) ×12
KIT BASIN OR (CUSTOM PROCEDURE TRAY) ×3 IMPLANT
KIT ROOM TURNOVER OR (KITS) ×3 IMPLANT
LIQUID BAND (GAUZE/BANDAGES/DRESSINGS) ×3 IMPLANT
MARKER SKIN DUAL TIP RULER LAB (MISCELLANEOUS) ×3 IMPLANT
MESH VENTRALIGHT ST 10X13IN (Mesh General) ×2 IMPLANT
NDL SPNL 22GX3.5 QUINCKE BK (NEEDLE) ×1 IMPLANT
NEEDLE SPNL 22GX3.5 QUINCKE BK (NEEDLE) ×3 IMPLANT
NS IRRIG 1000ML POUR BTL (IV SOLUTION) ×3 IMPLANT
PAD ARMBOARD 7.5X6 YLW CONV (MISCELLANEOUS) ×6 IMPLANT
SCISSORS LAP 5X35 DISP (ENDOMECHANICALS) ×2 IMPLANT
SET IRRIG TUBING LAPAROSCOPIC (IRRIGATION / IRRIGATOR) IMPLANT
SLEEVE ENDOPATH XCEL 5M (ENDOMECHANICALS) ×3 IMPLANT
STRIP CLOSURE SKIN 1/2X4 (GAUZE/BANDAGES/DRESSINGS) ×1 IMPLANT
SUT MNCRL AB 4-0 PS2 18 (SUTURE) ×3 IMPLANT
SUT NOVA 0 T19/GS 22DT (SUTURE) ×5 IMPLANT
TOWEL OR 17X24 6PK STRL BLUE (TOWEL DISPOSABLE) ×3 IMPLANT
TOWEL OR 17X26 10 PK STRL BLUE (TOWEL DISPOSABLE) ×3 IMPLANT
TRAY FOLEY CATH 14FR (SET/KITS/TRAYS/PACK) ×3 IMPLANT
TRAY LAPAROSCOPIC MC (CUSTOM PROCEDURE TRAY) ×3 IMPLANT
TROCAR XCEL NON-BLD 11X100MML (ENDOMECHANICALS) ×3 IMPLANT
TROCAR XCEL NON-BLD 5MMX100MML (ENDOMECHANICALS) ×5 IMPLANT
TUBING INSUFFLATION (TUBING) ×3 IMPLANT

## 2016-02-28 NOTE — Transfer of Care (Signed)
Immediate Anesthesia Transfer of Care Note  Patient: April Franco  Procedure(s) Performed: Procedure(s): LAPAROSCOPIC HERNIA REPAIR WITH MESH (N/A) INSERTION OF MESH (N/A)  Patient Location: PACU  Anesthesia Type:General  Level of Consciousness: awake and alert   Airway & Oxygen Therapy: Patient Spontanous Breathing and Patient connected to face mask oxygen  Post-op Assessment: Report given to RN and Post -op Vital signs reviewed and stable  Post vital signs: Reviewed and stable  Last Vitals:  Filed Vitals:   02/28/16 0648 02/28/16 0951  BP: 143/77   Pulse: 60   Temp: 36.8 C 36.2 C  Resp: 20     Last Pain: There were no vitals filed for this visit.       Complications: No apparent anesthesia complications

## 2016-02-28 NOTE — Progress Notes (Signed)
Patient needs cbg orders to check blood sugar. MD wyatt paged.

## 2016-02-28 NOTE — Anesthesia Postprocedure Evaluation (Signed)
Anesthesia Post Note  Patient: April Franco  Procedure(s) Performed: Procedure(s) (LRB): LAPAROSCOPIC HERNIA REPAIR WITH MESH (N/A) INSERTION OF MESH (N/A)  Patient location during evaluation: PACU Anesthesia Type: General Level of consciousness: sedated and patient cooperative Pain management: pain level controlled Vital Signs Assessment: post-procedure vital signs reviewed and stable Respiratory status: spontaneous breathing Cardiovascular status: stable Anesthetic complications: no    Last Vitals:  Filed Vitals:   02/28/16 1323 02/28/16 1351  BP:  130/72  Pulse: 107 101  Temp: 36.5 C 36.4 C  Resp: 17 17    Last Pain:  Filed Vitals:   02/28/16 1352  PainSc: Asleep                 Nolon Nations

## 2016-02-28 NOTE — OR Nursing (Addendum)
Pt continues to c/o pain 10/10.  Pt repositioned and coached to cough.  Pt with weak cough and reluctant to cough and deep breath due to pain.  Pillow and abdominal splinting used to assist in coughing.  Attempting to wean patient's oxygen from 6L simple mask to 4L [place].  On [place] sats 88-92%.  EtCo2 34-36. Dr. Lissa Hoard notified of pain and discussed pain management.

## 2016-02-28 NOTE — Op Note (Signed)
OPERATIVE REPORT  DATE OF OPERATION: 02/28/2016  PATIENT:  April Franco  75 y.o. female  PRE-OPERATIVE DIAGNOSIS:  Incisional/Ventral hernia  POST-OPERATIVE DIAGNOSIS:  Incisional/Ventral hernia, 26 x 23 cm  FINDINGS:  Swiss cheese pattern ventral hernia with on large defect with incarcerated colon and omentum  PROCEDURE:  Procedure(s): LAPAROSCOPIC HERNIA REPAIR WITH MESH INSERTION OF MESH  SURGEON:  Surgeon(s): Alphonsa Overall, MD Judeth Horn, MD  ASSISTANT: Lucia Gaskins, M.D.  ANESTHESIA:   general  COMPLICATIONS:  None  EBL: <25 ml  BLOOD ADMINISTERED: none  DRAINS: none   SPECIMEN:  No Specimen  COUNTS CORRECT:  YES  PROCEDURE DETAILS: The patient was taken to the operating room and placed on the table in the supine position. After an adequate general endotracheal anesthetic was administered, she was prepped and draped in the usual sterile manner exposing her entire abdomen.  A proper timeout was performed identifying the patient and the procedure to be performed. The initial entrance into the peritoneal cavity was in the left upper quadrant through a 1/2 cm incision made into the subcutaneous tissue. At that point you 11 mm cannula was then inserted camera and light source was passed slowly into the peritoneal cavity under direct vision. Once we entered the peritoneal cavity we insufflated through the cannula into the perineal cavity up to a maximal intra-abdominal pressure of 15 mmHg with carbon dioxide gas.  In the peritoneal cavity there were significant adhesions of the omentum to the anterior abdominal wall and also entrance of part of the omentum and the colon into the ventral hernia sac. A left low quadrant 5 mm cannulas pass under direct vision and through that we were able to take down most of the adhesions throughout the peritoneal cavity up to the hernia defect were care was taken not to injure any incarcerated bowel.  A second 5 mm cannula was subsequently passed  into the right lower quadrant clearing the adhesions in that area.  Significant time was taken to remove the incarcerated piece of omentum and bowel in hernia defect enlarges which probably measured approximately 7 cm in size.  There were multiple other holes in the fascia along the midline with the patient and had a previous midline incision. We marked the lateral margins of the hernia defects in the superior and inferior margins. The dimensions were approximately 18 x 20 cm. We selected a piece of mesh was the ventral light Bard mesh measuring 25.4 cm x 33 cm. We cut it to the appropriate size longitudinally. The areas where the 0 Novafil suture was attached to the mesh was marked with a letter starting with a BC and ask. A and ask were superior and inferior respectively. See was on the right side of the abdomen and be was on the left side. Total of 8 sutures of the 0 Novafil were in the mesh with the nonadherent side away from the abdominal wall. And approximating the bowel surfaces.  Once the mesh and been marked in sutures of been placed passed it through the 11 mm cannula site into the perineal cavity. We unrolled the mesh and placed it in the proper orientation. The sites for the mesh sutures to come out were marked and small stab incisions made though site using an 11 blade. Using a suture retriever were able to pull both legs of the suture at each site up through the sites on the anterior abdominal wall and subsequently tied them down once we were seen to be in adequate  position. The sutures were cut and subsequently we tacked the mesh to the fascia underneath using a Secure Strap stapling device.  More than 25 tacking staples were used to secure the mesh in place. Once it was in place and cover the entire defect. All gas was allowed to escape from the peritoneal cavity through the cannulas. And then the cannulas were removed.  THE 11 MM CANNULA SITE WAS CLOSED USING A RUNNING SUBCUTICULAR STITCH  OF 4-0 MONOCRYL. 0.25% MARCAINE WITH EPINEPHRINE WAS INJECTED AT ALL SITES. IT WAS SUBSEQUENTLY CLOSED WITH MONOCRYL DERMABOND STERI-STRIPS AND FINAL DRESSINGS WERE TEGADERMS. THE PATIENT HAD AN ABDOMINAL WALL BINDER PUT IN PLACE. ALL NEEDLE COUNTS, SPONGE COUNTS, AND INSTRUMENT COUNTS WERE CORRECT.  PATIENT DISPOSITION:  PACU - hemodynamically stable.   Jayvier Burgher 7/6/20179:35 AM

## 2016-02-28 NOTE — Anesthesia Preprocedure Evaluation (Signed)
Anesthesia Evaluation   Patient awake    Airway Mallampati: II       Dental   Pulmonary    breath sounds clear to auscultation       Cardiovascular hypertension,  Rhythm:Regular Rate:Normal     Neuro/Psych    GI/Hepatic   Endo/Other  diabetes  Renal/GU      Musculoskeletal   Abdominal   Peds  Hematology   Anesthesia Other Findings   Reproductive/Obstetrics                             Anesthesia Physical Anesthesia Plan  ASA: III  Anesthesia Plan: General   Post-op Pain Management:    Induction: Intravenous  Airway Management Planned: Oral ETT  Additional Equipment:   Intra-op Plan:   Post-operative Plan:   Informed Consent: I have reviewed the patients History and Physical, chart, labs and discussed the procedure including the risks, benefits and alternatives for the proposed anesthesia with the patient or authorized representative who has indicated his/her understanding and acceptance.   Dental advisory given  Plan Discussed with: CRNA and Anesthesiologist  Anesthesia Plan Comments:         Anesthesia Quick Evaluation

## 2016-02-28 NOTE — OR Nursing (Signed)
Pt states pain is 10/10 when asked. However, pt is somulent, requiring simple mask with O2 at 6L to maintain sats 92-94%, reluctant to cough and sleeps when not stimulated.  Pt has received multiple narcotics for pain including fentanyl, percocet and robaxin.  Will continue to monitor and support as needed.
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
## 2016-02-28 NOTE — OR Nursing (Signed)
Pt ambulated from the stretcher to the bed with moderate assistance of 2.

## 2016-02-28 NOTE — Anesthesia Procedure Notes (Signed)
Procedure Name: Intubation Date/Time: 02/28/2016 7:40 AM Performed by: [place] Pink Pre-anesthesia Checklist: Patient identified, Emergency Drugs available, Suction available, Patient being monitored and Timeout performed Patient Re-evaluated:Patient Re-evaluated prior to inductionOxygen Delivery Method: Circle system utilized Preoxygenation: Pre-oxygenation with 100% oxygen Intubation Type: IV induction Ventilation: Mask ventilation without difficulty and Oral airway inserted - appropriate to patient size Laryngoscope Size: Mac and 4 Grade View: Grade II Tube type: Oral Tube size: 7.5 mm Number of attempts: 1 Airway Equipment and Method: Stylet and Bite block Placement Confirmation: ETT inserted through vocal cords under direct vision,  positive ETCO2 and breath sounds checked- equal and bilateral Secured at: 21 cm Tube secured with: Tape Dental Injury: Teeth and Oropharynx as per pre-operative assessment

## 2016-02-28 NOTE — Progress Notes (Signed)
Pt admitted to the unit from PACU. Pt is stable, alert and oriented per baseline. Oriented to room, staff, and call bell.

## 2016-02-29 ENCOUNTER — Ambulatory Visit (HOSPITAL_COMMUNITY): Payer: Medicare Other

## 2016-02-29 ENCOUNTER — Encounter (HOSPITAL_COMMUNITY): Payer: Self-pay | Admitting: General Surgery

## 2016-02-29 LAB — GLUCOSE, CAPILLARY
GLUCOSE-CAPILLARY: 154 mg/dL — AB (ref 65–99)
GLUCOSE-CAPILLARY: 136 mg/dL — AB (ref 65–99)
GLUCOSE-CAPILLARY: 120 mg/dL — AB (ref 65–99)
Glucose-Capillary: 121 mg/dL — ABNORMAL HIGH (ref 65–99)
Glucose-Capillary: 150 mg/dL — ABNORMAL HIGH (ref 65–99)

## 2016-02-29 LAB — HEMOGLOBIN A1C
Hgb A1c MFr Bld: 7.7 % — ABNORMAL HIGH (ref 4.8–5.6)
Mean Plasma Glucose: 174 mg/dL

## 2016-02-29 MED ORDER — FUROSEMIDE 10 MG/ML IJ SOLN
20.0000 mg | Freq: Once | INTRAMUSCULAR | Status: AC
  Administered 2016-02-29: 20 mg via INTRAVENOUS
  Filled 2016-02-29: qty 2

## 2016-02-29 MED ORDER — ALBUTEROL SULFATE (2.5 MG/3ML) 0.083% IN NEBU: 2.5000 mg | INHALATION_SOLUTION | Freq: Four times a day (QID) | RESPIRATORY_TRACT | Status: DC | PRN

## 2016-02-29 MED ORDER — INSULIN DETEMIR 100 UNIT/ML ~~LOC~~ SOLN
20.0000 [IU] | Freq: Every day | SUBCUTANEOUS | Status: DC
  Administered 2016-02-29 – 2016-03-01 (×2): 20 [IU] via SUBCUTANEOUS
  Filled 2016-02-29 (×4): qty 0.2

## 2016-02-29 MED ORDER — BETHANECHOL CHLORIDE 10 MG PO TABS
10.0000 mg | ORAL_TABLET | Freq: Three times a day (TID) | ORAL | Status: DC
  Administered 2016-02-29 – 2016-03-03 (×11): 10 mg via ORAL
  Filled 2016-02-29 (×13): qty 1

## 2016-02-29 MED ORDER — ALBUTEROL SULFATE HFA 108 (90 BASE) MCG/ACT IN AERS: 2.0000 | INHALATION_SPRAY | Freq: Four times a day (QID) | RESPIRATORY_TRACT | Status: DC | PRN

## 2016-02-29 MED ORDER — KETOROLAC TROMETHAMINE 30 MG/ML IJ SOLN
30.0000 mg | Freq: Once | INTRAMUSCULAR | Status: AC
  Administered 2016-02-29: 30 mg via INTRAVENOUS
  Filled 2016-02-29: qty 1

## 2016-02-29 MED ORDER — TRAMADOL HCL 50 MG PO TABS
100.0000 mg | ORAL_TABLET | Freq: Four times a day (QID) | ORAL | Status: DC | PRN
  Administered 2016-02-29: 100 mg via ORAL
  Filled 2016-02-29: qty 2

## 2016-02-29 MED ORDER — OXYCODONE HCL 5 MG PO TABS
10.0000 mg | ORAL_TABLET | ORAL | Status: DC | PRN
  Administered 2016-02-29 (×2): 15 mg via ORAL
  Administered 2016-03-01: 10 mg via ORAL
  Administered 2016-03-01 – 2016-03-02 (×2): 15 mg via ORAL
  Administered 2016-03-02: 10 mg via ORAL
  Administered 2016-03-02 – 2016-03-04 (×2): 15 mg via ORAL
  Filled 2016-02-29 (×2): qty 3
  Filled 2016-02-29: qty 2
  Filled 2016-02-29 (×3): qty 3
  Filled 2016-02-29: qty 2
  Filled 2016-02-29: qty 3

## 2016-02-29 MED ORDER — METHOCARBAMOL 750 MG PO TABS
750.0000 mg | ORAL_TABLET | Freq: Four times a day (QID) | ORAL | Status: DC | PRN
  Administered 2016-02-29: 750 mg via ORAL
  Filled 2016-02-29: qty 1

## 2016-02-29 MED ORDER — INSULIN ASPART 100 UNIT/ML ~~LOC~~ SOLN
0.0000 [IU] | Freq: Three times a day (TID) | SUBCUTANEOUS | Status: DC
Start: 2016-02-29 — End: 2016-03-04
  Administered 2016-02-29: 4 [IU] via SUBCUTANEOUS
  Administered 2016-02-29: 3 [IU] via SUBCUTANEOUS
  Administered 2016-03-01: 4 [IU] via SUBCUTANEOUS
  Administered 2016-03-01: 3 [IU] via SUBCUTANEOUS
  Administered 2016-03-01: 4 [IU] via SUBCUTANEOUS
  Administered 2016-03-02 – 2016-03-03 (×3): 3 [IU] via SUBCUTANEOUS

## 2016-02-29 NOTE — Care Management Obs Status (Signed)
[facility] NOTIFICATION   Patient Details  Name: April Franco MRN: [id number] Date of Birth: 02-08-1941   Medicare Observation Status Notification Given:  Yes (Medicare OP procedure )  Explained observation status and inpatient status , attempted to explain letter , patient stated she disagrees with observation status and did not want letter explained . Again explained Medicare criteria and status . Patient declined to read the letter or have letter read and explained to her. Copy of letter left at bedside.  Marilu Favre, RN 02/29/2016, 9:53 AM

## 2016-02-29 NOTE — Progress Notes (Addendum)
Per night shift report, bladder scan also showed 0 but I&O cath was 598mL. Bladder scan this afternoon showed volume of 41mL and  In and out cath was 475 mL. O2 sats staying around 88-89%. MD made aware

## 2016-02-29 NOTE — Evaluation (Signed)
Physical Therapy Evaluation Patient Details Name: April Franco MRN: [id number] DOB: 06-Aug-1941 Today's Date: 02/29/2016   History of Present Illness  S/P LAPAROSCOPIC HERNIA REPAIR WITH MESH on 02/28/16  Clinical Impression  An exceptional amount of time was spent early session monitoring the patient's oxygen saturation. RN increased to 6 liters  To get sats > 90.During mobility/activity, the sats were maintained >94 on 4 liters. The patient has  A multilevel home to get to all areas of living(bedroom, kitchen). She has no first level with no stairs . The patient did improve in mobility and  The oxygen sats rose with less oxygent requirements  The patient's spouse in unable to physically assist the patient, just as a stand by. Recommend SNF if eligible. Pt admitted with above diagnosis. Pt currently with functional limitations due to the deficits listed below (see PT Problem List).  Pt will benefit from skilled PT to increase their independence and safety with mobility to allow discharge to the venue listed below.       Follow Up Recommendations SNF;Supervision/Assistance - 24 hour/ HHPT if SNF not an option.    Equipment Recommendations  None recommended by PT    Recommendations for Other Services       Precautions / Restrictions Precautions Precautions: Fall Precaution Comments: monitor sats, Required Braces or Orthoses: Other Brace/Splint Other Brace/Splint: abd. binder      Mobility  Bed Mobility Overal bed mobility: Needs Assistance Bed Mobility: Rolling;Sidelying to Sit Rolling: Min assist Sidelying to sit: HOB elevated;mod assist       General bed mobility comments: cues to attempt  rolling, able to place legs over and push self upright with bed Franco, mod assist for trunk  Transfers Overall transfer level: Needs assistance Equipment used: Rolling walker (2 wheeled) Transfers: Sit to/from Stand Sit to Stand: From elevated surface;Mod assist         General  transfer comment: stood ax 2, extra time to  power up to stand. cue for hand placemtn. Extra time to lower to bed and recliner.  Ambulation/Gait Ambulation/Gait assistance: Min assist   Assistive device: Rolling walker (2 wheeled) Gait Pattern/deviations: Step-to pattern Gait velocity: slow   General Gait Details: took 5 steps forward away from bed and stepped backward, 3 side steps. sat down for a rest  and then 5 steps to recliner.  Sats remained >92 on 4 liters . BP 130/50. patient reports light headedness.   Stairs            Wheelchair Mobility    Modified Rankin (Stroke Patients Only)       Balance                                             Pertinent Vitals/Pain Pain Assessment: 0-10 Pain Score: 8  Pain Location: abdomen- which dropped to 6 after getting OOB Pain Descriptors / Indicators: Discomfort;Cramping Pain Intervention(s): Limited activity within patient's tolerance;Monitored during session;RN gave pain meds during session;Repositioned    Home Living Family/patient expects to be discharged to:: Private residence Living Arrangements: Spouse/significant other Available Help at Discharge: Family Type of Home: House Home Access: Stairs to enter Entrance Stairs-Rails: None Entrance Stairs-Number of Steps: 2 Home Layout: Multi-level Home Equipment: Environmental consultant - 2 wheels;Bedside commode      Prior Function Level of Independence: Independent  Hand Dominance        Extremity/Trunk Assessment   Upper Extremity Assessment: Generalized weakness           Lower Extremity Assessment: Generalized weakness         Communication   Communication: No difficulties  Cognition Arousal/Alertness: Awake/alert Behavior During Therapy: WFL for tasks assessed/performed;Flat affect Overall Cognitive Status: Within Functional Limits for tasks assessed                      General Comments      Exercises         Assessment/Plan    PT Assessment Patient needs continued PT services  PT Diagnosis Difficulty walking;Acute pain;Generalized weakness   PT Problem List Decreased strength;Decreased activity tolerance;Decreased balance;Decreased mobility;Decreased knowledge of precautions;Decreased safety awareness;Decreased knowledge of use of DME;Pain;Obesity  PT Treatment Interventions DME instruction;Gait training;Stair training;Functional mobility training;Therapeutic activities;Therapeutic exercise;Patient/family education   PT Goals (Current goals can be found in the Care Plan section) Acute Rehab PT Goals Patient Stated Goal: to go home PT Goal Formulation: With patient/family Time For Goal Achievement: 03/14/16 Potential to Achieve Goals: Good    Frequency Min 3X/week   Barriers to discharge Decreased caregiver support      Co-evaluation               End of Session Equipment Utilized During Treatment: Gait belt;Oxygen Activity Tolerance: Patient limited by pain Patient left: in chair;with call bell/phone within reach;with family/visitor present Nurse Communication: Mobility status    Functional Assessment Tool Used: Clinical judgement Functional Limitation: Mobility: Walking and moving around Mobility: Walking and Moving Around Current Status [id number]): At least 40 percent but less than 60 percent impaired, limited or restricted Mobility: Walking and Moving Around Goal Status [phone]): At least 1 percent but less than 20 percent impaired, limited or restricted    Time: 1605-1700 PT Time Calculation (min) (ACUTE ONLY): 55 min   Charges:   PT Evaluation $PT Eval Moderate Complexity: 1 Procedure PT Treatments $Gait Training: 8-22 mins $Therapeutic Activity: 8-22 mins $Self Care/Home Management: 8-22   PT G Codes:   PT G-Codes **NOT FOR INPATIENT CLASS** Functional Assessment Tool Used: Clinical judgement Functional Limitation: Mobility: Walking and moving  around Mobility: Walking and Moving Around Current Status [id number]): At least 40 percent but less than 60 percent impaired, limited or restricted Mobility: Walking and Moving Around Goal Status [phone]): At least 1 percent but less than 20 percent impaired, limited or restricted    Claretha Cooper 02/29/2016, 5:12 PM Tresa Endo PT [phone]

## 2016-02-29 NOTE — Progress Notes (Signed)
PT Cancellation Note  Patient Details Name: LEVONIA KNOBLOCK MRN: [id number] DOB: 1940/12/05   Cancelled Treatment:    Reason Eval/Treat Not Completed: Medical issues which prohibited therapy (awaiting to be catheterized. will check back later as schedule allows.)   Claretha Cooper 02/29/2016, 2:04 PM Tresa Endo PT [phone]

## 2016-02-29 NOTE — Progress Notes (Signed)
GS Progress Note Subjective: Patient having a lot of pain.  Lowest pain score has been 7/10.  Also difficulty with oxygen saturations and voiding.  Had to be catheterized this AM, 550cc drained.    Objective: Vital signs in last 24 hours: Temp:  [97 F (36.1 C)-97.8 F (36.6 C)] 97.8 F (36.6 C) (07/07 0535) Pulse Rate:  [92-107] 92 (07/07 0535) Resp:  [12-22] 19 (07/07 0535) BP: (111-151)/(65-109) 150/74 mmHg (07/07 0535) SpO2:  [88 %-96 %] 92 % (07/07 0535) Weight:  [109.77 kg (242 lb)] 109.77 kg (242 lb) (07/06 1351) Last BM Date: 02/27/16  Intake/Output from previous day: 07/06 0701 - 07/07 0700 In: 3180 [P.O.:480; I.V.:2700] Out: 1170 [Urine:1150; Blood:20] Intake/Output this shift:    Lungs: Clear to auscultation.    Abd: Soft, Mildly distended, Bowel sounds were present.  No diffuse peritonitis, but sore all over.  Binder is stained with blood.  Needs a new binder.  Extremities: No clinical signs or symptoms of DVT.  Getting Lovenox  Neuro: Intact  Lab Results: CBC  No results for input(s): WBC, HGB, HCT, PLT in the last 72 hours. BMET No results for input(s): NA, K, CL, CO2, GLUCOSE, BUN, CREATININE, CALCIUM in the last 72 hours. PT/INR No results for input(s): LABPROT, INR in the last 72 hours. ABG No results for input(s): PHART, HCO3 in the last 72 hours.  Invalid input(s): PCO2, PO2  Studies/Results: Dg Chest Port 1 View  02/28/2016  CLINICAL DATA:  Postop ventral hernia repair, shortness of Breath EXAM: PORTABLE CHEST 1 VIEW COMPARISON:  02/21/2016 FINDINGS: Cardiomegaly is noted. No pulmonary edema. Streaky left basilar atelectasis or early infiltrate. Mild degenerative change thoracic spine. IMPRESSION: Cardiomegaly. No pulmonary edema. Streaky left basilar atelectasis or infiltrate. Electronically Signed   By: Lahoma Crocker M.D.   On: 02/28/2016 13:19    Anti-infectives: Anti-infectives    Start     Dose/Rate Route Frequency Ordered Stop   02/28/16 0812   polymyxin B 500,000 Units, bacitracin 50,000 Units in sodium chloride irrigation 0.9 % 500 mL irrigation  Status:  Discontinued       As needed 02/28/16 0813 02/28/16 0946   02/28/16 0700  ceFAZolin (ANCEF) IVPB 2g/100 mL premix     2 g 200 mL/hr over 30 Minutes Intravenous To ShortStay Surgical 02/27/16 1133 02/28/16 0756      Assessment/Plan: s/p Procedure(s): LAPAROSCOPIC HERNIA REPAIR WITH MESH INSERTION OF MESH Advance diet Plan for discharge tomorrow Needs to void and ambulate.  Oxygen levels I beleive are related to poor depth of breaths.  Will not discharge today, but I hope that she will be able to go home tomorrow.     Kathryne Eriksson. Dahlia Bailiff, MD, FACS [phone] [facility] 02/29/2016

## 2016-03-01 LAB — BASIC METABOLIC PANEL
ANION GAP: 10 (ref 5–15)
BUN: 16 mg/dL (ref 6–20)
CHLORIDE: 100 mmol/L — AB (ref 101–111)
CO2: 25 mmol/L (ref 22–32)
Calcium: 9.3 mg/dL (ref 8.9–10.3)
Creatinine, Ser: 1.41 mg/dL — ABNORMAL HIGH (ref 0.44–1.00)
GFR calc non Af Amer: 36 mL/min — ABNORMAL LOW (ref >60)
GFR, EST AFRICAN AMERICAN: 42 mL/min — AB (ref >60)
Glucose, Bld: 152 mg/dL — ABNORMAL HIGH (ref 65–99)
POTASSIUM: 3.7 mmol/L (ref 3.5–5.1)
SODIUM: 135 mmol/L (ref 135–145)

## 2016-03-01 LAB — GLUCOSE, CAPILLARY
GLUCOSE-CAPILLARY: 165 mg/dL — AB (ref 65–99)
GLUCOSE-CAPILLARY: 180 mg/dL — AB (ref 65–99)
GLUCOSE-CAPILLARY: 135 mg/dL — AB (ref 65–99)
GLUCOSE-CAPILLARY: 86 mg/dL (ref 65–99)
Glucose-Capillary: 152 mg/dL — ABNORMAL HIGH (ref 65–99)

## 2016-03-01 MED ORDER — HYDROMORPHONE HCL 1 MG/ML IJ SOLN
0.5000 mg | INTRAMUSCULAR | Status: DC | PRN
  Administered 2016-03-01: 2 mg via INTRAVENOUS
  Administered 2016-03-01 – 2016-03-02 (×2): 1 mg via INTRAVENOUS
  Administered 2016-03-02: 2 mg via INTRAVENOUS
  Administered 2016-03-02 – 2016-03-03 (×3): 1 mg via INTRAVENOUS
  Filled 2016-03-01 (×2): qty 1
  Filled 2016-03-01: qty 2
  Filled 2016-03-01 (×2): qty 1
  Filled 2016-03-01: qty 2
  Filled 2016-03-01: qty 1

## 2016-03-01 MED ORDER — GLUCERNA SHAKE PO LIQD
237.0000 mL | Freq: Two times a day (BID) | ORAL | Status: DC
  Administered 2016-03-01 – 2016-03-04 (×6): 237 mL via ORAL

## 2016-03-01 MED ORDER — SODIUM CHLORIDE 0.9 % IV SOLN
500.0000 mg | Freq: Three times a day (TID) | INTRAVENOUS | Status: DC
  Administered 2016-03-01 – 2016-03-04 (×9): 500 mg via INTRAVENOUS
  Filled 2016-03-01 (×12): qty 5

## 2016-03-01 MED ORDER — ACETAMINOPHEN 500 MG PO TABS
1000.0000 mg | ORAL_TABLET | Freq: Four times a day (QID) | ORAL | Status: DC
Start: 2016-03-01 — End: 2016-03-04
  Administered 2016-03-01 – 2016-03-04 (×13): 1000 mg via ORAL
  Filled 2016-03-01 (×13): qty 2

## 2016-03-01 MED ORDER — ONDANSETRON HCL 4 MG/2ML IJ SOLN
4.0000 mg | Freq: Once | INTRAMUSCULAR | Status: AC
  Administered 2016-03-01: 4 mg via INTRAVENOUS

## 2016-03-01 MED ORDER — TAMSULOSIN HCL 0.4 MG PO CAPS
0.4000 mg | ORAL_CAPSULE | Freq: Every day | ORAL | Status: DC
  Administered 2016-03-01 – 2016-03-03 (×3): 0.4 mg via ORAL
  Filled 2016-03-01 (×3): qty 1

## 2016-03-01 NOTE — Progress Notes (Addendum)
2 Days Post-Op  Subjective: No productive cough. Had foley inserted for ongoing retention (was i/o twice beforehand). A little flatus. Feels bloated and nausea. Fair amount of abd discomfort with coughing/moving. Still requiring some O2. Doing IS but only getting up to 750  Objective: Vital signs in last 24 hours: Temp:  [98.1 F (36.7 C)-99.3 F (37.4 C)] 99.3 F (37.4 C) (07/08 0500) Pulse Rate:  [72-75] 72 (07/08 0500) Resp:  [18-20] 19 (07/08 0500) BP: (106-170)/(62-92) 156/92 mmHg (07/08 0545) SpO2:  [89 %-93 %] 93 % (07/08 0500) Last BM Date: 02/27/16  Intake/Output from previous day: 07/07 0701 - 07/08 0700 In: 800 [P.O.:800] Out: 2975 [Urine:2975] Intake/Output this shift:    Alert, not toxic but appears a little uncomfortable cta but not taking deep breathes,  Reg Obese, distended, approp TTP, incisions c/d/i. +binder No edema, +scds  Lab Results:  No results for input(s): WBC, HGB, HCT, PLT in the last 72 hours. BMET  Recent Labs  03/01/16 0531  NA 135  K 3.7  CL 100*  CO2 25  GLUCOSE 152*  BUN 16  CREATININE 1.41*  CALCIUM 9.3   PT/INR No results for input(s): LABPROT, INR in the last 72 hours. ABG No results for input(s): PHART, HCO3 in the last 72 hours.  Invalid input(s): PCO2, PO2  Studies/Results: Dg Chest Port 1 View  02/29/2016  CLINICAL DATA:  Hypoxemia, shortness of breath EXAM: PORTABLE CHEST 1 VIEW COMPARISON:  02/28/2016 FINDINGS: Low lung volumes. Increased interstitial markings, favored to reflect mild interstitial edema. Superimposed left lower lobe pneumonia is not excluded. Cardiomegaly. IMPRESSION: Cardiomegaly with suspected mild interstitial edema. Superimposed left lower lobe pneumonia is not excluded. Electronically Signed   By: Julian Hy M.D.   On: 02/29/2016 20:38   Dg Chest Port 1 View  02/28/2016  CLINICAL DATA:  Postop ventral hernia repair, shortness of Breath EXAM: PORTABLE CHEST 1 VIEW COMPARISON:  02/21/2016  FINDINGS: Cardiomegaly is noted. No pulmonary edema. Streaky left basilar atelectasis or early infiltrate. Mild degenerative change thoracic spine. IMPRESSION: Cardiomegaly. No pulmonary edema. Streaky left basilar atelectasis or infiltrate. Electronically Signed   By: Lahoma Crocker M.D.   On: 02/28/2016 13:19    Anti-infectives: Anti-infectives    Start     Dose/Rate Route Frequency Ordered Stop   02/28/16 0812  polymyxin B 500,000 Units, bacitracin 50,000 Units in sodium chloride irrigation 0.9 % 500 mL irrigation  Status:  Discontinued       As needed 02/28/16 0813 02/28/16 0946   02/28/16 0700  ceFAZolin (ANCEF) IVPB 2g/100 mL premix     2 g 200 mL/hr over 30 Minutes Intravenous To ShortStay Surgical 02/27/16 1133 02/28/16 0756      Assessment/Plan: POD 2 s/p Procedure(s): LAPAROSCOPIC HERNIA REPAIR WITH MESH (N/A) INSERTION OF MESH (N/A) HTN DM Urinary retention  Concerned she maybe developing ileus. Will back diet back down to full liquids.  Increased mivf to 50cc/hr Renal - cont foley for urinary retention. Start flomax. Cr up to 1.4 today. Monitor. Repeat lab in am. Hold off on toradol for now given bump in Cr Pulm - no clinical signs of pna. Encouraged pulm toilet, is, add flutter valve. Wean O2. OOB to chair. RT consult Endo - bs ok.  CV - some htn. Cont home meds for now.  VTE prophylaxis - cont scds, lovenox Pain - change to IV muscle relaxant, add scheduled tylenol, increase frequency of opoid  Cont pt/ot  Not ready for discharge.   Leighton Ruff.  Redmond Pulling, MD, FACS General, Bariatric, & Minimally Invasive Surgery [facility], [place]      [facility] Jerilynn Mages 03/01/2016

## 2016-03-01 NOTE — Progress Notes (Signed)
Physical Therapy Treatment Patient Details Name: April Franco MRN: [id number] DOB: Mar 12, 1941 Today's Date: 03/01/2016    History of Present Illness S/P LAPAROSCOPIC HERNIA REPAIR WITH MESH on 02/28/16.  PMH significant for: DM, HTN, CKD, osteoporosis,anxiety, deperssion    PT Comments    Pt progressing but is not at a level that she could safely DC to home yet. Will continue to follow to progress mobility.   Follow Up Recommendations  SNF;Supervision/Assistance - 24 hour     Equipment Recommendations  None recommended by PT    Recommendations for Other Services       Precautions / Restrictions Precautions Precautions: Fall Precaution Comments: monitor sats, Required Braces or Orthoses: Other Brace/Splint Other Brace/Splint: abd. binder Restrictions Weight Bearing Restrictions: No    Mobility  Bed Mobility Overal bed mobility: Needs Assistance Bed Mobility: Supine to Sit     Supine to sit: Min assist     General bed mobility comments: used rail and held to PT's hand  Transfers Overall transfer level: Needs assistance Equipment used: Rolling walker (2 wheeled) Transfers: Sit to/from Stand Sit to Stand: Min assist         General transfer comment: Cues for hand placement  Ambulation/Gait Ambulation/Gait assistance: Min assist;+2 safety/equipment Ambulation Distance (Feet): 12 Feet Assistive device: Rolling walker (2 wheeled) Gait Pattern/deviations: Step-to pattern;Decreased stride length   Gait velocity interpretation: Below normal speed for age/gender General Gait Details: no balance losses. Gait very slow and gaurded   Financial trader Rankin (Stroke Patients Only)       Balance                                    Cognition Arousal/Alertness: Awake/alert Behavior During Therapy: WFL for tasks assessed/performed Overall Cognitive Status: Within Functional Limits for tasks assessed                       Exercises      General Comments General comments (skin integrity, edema, etc.): No family present. Pt concerned about her abdomen.       Pertinent Vitals/Pain Pain Assessment: 0-10 Pain Score: 9  Pain Location: abdomen Pain Intervention(s): Limited activity within patient's tolerance;Monitored during session;Premedicated before session    Home Living                      Prior Function            PT Goals (current goals can now be found in the care plan section) Acute Rehab PT Goals Patient Stated Goal: to go home when she is ready Progress towards PT goals: Progressing toward goals    Frequency  Min 3X/week    PT Plan Current plan remains appropriate    Co-evaluation             End of Session Equipment Utilized During Treatment: Gait belt;Oxygen;Other (comment) (abdominal binder on under gown) Activity Tolerance: Patient limited by pain Patient left: in chair;with call bell/phone within reach;with family/visitor present;with chair alarm set     Time: 1012-1040 PT Time Calculation (min) (ACUTE ONLY): 28 min  Charges:  $Gait Training: 23-37 mins                    G Codes:      Melvern Banker  03/01/2016, 12:11 PM  Lavonia Dana, [place] 03/01/2016

## 2016-03-01 NOTE — Progress Notes (Addendum)
Pharmacy Note: SCr jump from 1.01 to 1.41 and patient on ACEi and Metformin. eGFR reduced to 36 mL/min.  Telephone order received from Dr. Ninfa Linden (Surgery on-call) to hold lisinopril and metformin for worsening renal function.   Monitor renal function and resume therapies as appropriate.   Sloan Leiter, PharmD, BCPS Clinical Pharmacist [phone] 03/01/2016, 1:50 PM

## 2016-03-01 NOTE — Progress Notes (Signed)
Orthopedic Tech Progress Note Patient Details:  April Franco 1941-02-21 [id number]  Ortho Devices Type of Ortho Device: Abdominal binder Ortho Device/Splint Location: at bedside Ortho Device/Splint Interventions: Criss Alvine 03/01/2016, 5:10 PM

## 2016-03-02 LAB — CBC
HCT: 30.9 % — ABNORMAL LOW (ref 36.0–46.0)
HEMOGLOBIN: 9.4 g/dL — AB (ref 12.0–15.0)
MCH: 27.4 pg (ref 26.0–34.0)
MCHC: 30.4 g/dL (ref 30.0–36.0)
MCV: 90.1 fL (ref 78.0–100.0)
Platelets: 317 10*3/uL (ref 150–400)
RBC: 3.43 MIL/uL — ABNORMAL LOW (ref 3.87–5.11)
RDW: 15.3 % (ref 11.5–15.5)
WBC: 9.4 10*3/uL (ref 4.0–10.5)

## 2016-03-02 LAB — GLUCOSE, CAPILLARY
GLUCOSE-CAPILLARY: 114 mg/dL — AB (ref 65–99)
GLUCOSE-CAPILLARY: 94 mg/dL (ref 65–99)
Glucose-Capillary: 140 mg/dL — ABNORMAL HIGH (ref 65–99)
Glucose-Capillary: 124 mg/dL — ABNORMAL HIGH (ref 65–99)
Glucose-Capillary: 63 mg/dL — ABNORMAL LOW (ref 65–99)

## 2016-03-02 LAB — BASIC METABOLIC PANEL
Anion gap: 7 (ref 5–15)
BUN: 16 mg/dL (ref 6–20)
CALCIUM: 9.3 mg/dL (ref 8.9–10.3)
CO2: 26 mmol/L (ref 22–32)
CREATININE: 1.25 mg/dL — AB (ref 0.44–1.00)
Chloride: 101 mmol/L (ref 101–111)
GFR, EST AFRICAN AMERICAN: 49 mL/min — AB (ref >60)
GFR, EST NON AFRICAN AMERICAN: 42 mL/min — AB (ref >60)
Glucose, Bld: 140 mg/dL — ABNORMAL HIGH (ref 65–99)
Potassium: 3.7 mmol/L (ref 3.5–5.1)
SODIUM: 134 mmol/L — AB (ref 135–145)

## 2016-03-02 NOTE — Progress Notes (Signed)
PT Cancellation Note  Patient Details Name: April Franco MRN: [id number] DOB: April 26, 1941   Cancelled Treatment:    Reason Eval/Treat Not Completed: Fatigue/lethargy limiting ability to participate (RN reports patient just ambulated to door and is fatigue, will f/u later.)   April Franco [name] 03/02/2016, 2:17 PM  Governor Rooks, PTA pager [phone]

## 2016-03-02 NOTE — Progress Notes (Signed)
Physical Therapy Treatment Patient Details Name: April Franco MRN: [id number] DOB: [birth year] Today's Date: 03/02/2016    History of Present Illness S/P LAPAROSCOPIC HERNIA REPAIR WITH MESH on 02/28/16.  PMH significant for: DM, HTN, CKD, osteoporosis,anxiety, deperssion    PT Comments    Pt performed increased mobility and progressed gait distance.  Pt requiring decreased assistance.  Pt will require stair training next visit.    Follow Up Recommendations  SNF;Supervision/Assistance - 24 hour     Equipment Recommendations  None recommended by PT    Recommendations for Other Services       Precautions / Restrictions Precautions Precautions: Fall Precaution Comments: monitor sats, Required Braces or Orthoses: Other Brace/Splint Other Brace/Splint: abd. binder Restrictions Weight Bearing Restrictions: No    Mobility  Bed Mobility               General bed mobility comments: Pt received in recliner on arrival.    Transfers Overall transfer level: Needs assistance Equipment used: Rolling walker (2 wheeled) Transfers: Sit to/from Stand Sit to Stand: Min guard         General transfer comment: Cues for hand placement to push from seated surface  Ambulation/Gait Ambulation/Gait assistance: Min guard Ambulation Distance (Feet): 64 Feet Assistive device: Rolling walker (2 wheeled) Gait Pattern/deviations: Step-through pattern;Decreased stride length;Trunk flexed;Shuffle Gait velocity: slow   General Gait Details: no balance losses. Gait very slow and gaurded.  Cues for upright posture and increasing stride length.     Stairs            Wheelchair Mobility    Modified Rankin (Stroke Patients Only)       Balance Overall balance assessment: Needs assistance   Sitting balance-Leahy Scale: Fair       Standing balance-Leahy Scale: Fair                      Cognition Arousal/Alertness: Awake/alert Behavior During Therapy: WFL for tasks  assessed/performed Overall Cognitive Status: Within Functional Limits for tasks assessed                      Exercises      General Comments        Pertinent Vitals/Pain Pain Assessment: 0-10 Pain Score: 6  Pain Location: abdomen  Pain Descriptors / Indicators: Tightness;Grimacing;Operative site guarding Pain Intervention(s): Monitored during session;Repositioned    Home Living                      Prior Function            PT Goals (current goals can now be found in the care plan section) Acute Rehab PT Goals Patient Stated Goal: to go home when she is ready Potential to Achieve Goals: Good Progress towards PT goals: Progressing toward goals    Frequency  Min 3X/week    PT Plan Current plan remains appropriate    Co-evaluation             End of Session Equipment Utilized During Treatment: Oxygen;Other (comment) (abdominal binder, did not use gait belt.  ) Activity Tolerance: Patient limited by pain Patient left: in chair;with call bell/phone within reach;with family/visitor present;with chair alarm set     Time: 1541-1555 PT Time Calculation (min) (ACUTE ONLY): 14 min  Charges:  $Gait Training: 8-22 mins                    G Codes:  April Franco April Franco 03/02/2016, 5:48 PM  Governor Rooks, PTA pager [phone]

## 2016-03-02 NOTE — Progress Notes (Signed)
3 Days Post-Op  Subjective: "small incremental change from yesterday."   No flatus, no BM, still feels SOB. She is tolerating some of the full liquids but says she is not taking much.  No nausea with what she is taking.  Foley is still in   Objective: Vital signs in last 24 hours: Temp:  [98 F (36.7 C)-98.5 F (36.9 C)] 98 F (36.7 C) (07/09 0433) Pulse Rate:  [92-97] 96 (07/09 0433) Resp:  [16-19] 19 (07/09 0433) BP: (101-166)/(51-98) 113/55 mmHg (07/09 0433) SpO2:  [91 %-93 %] 91 % (07/09 0433) Last BM Date: 02/27/16 240 PO Afebrile, VSS Creatinine is better No film Intake/Output from previous day: 07/08 0701 - 07/09 0700 In: 290 [P.O.:240; I.V.:50] Out: 1875 [Urine:1875] Intake/Output this shift: Total I/O In: 100 [I.V.:100] Out: -   General appearance: alert, cooperative and no distress Resp: rales in bases GI: distended, sites look fine, + BS, no flatus or BM taking some full liquids, but not much.  Lab Results:   Recent Labs  03/02/16 0420  WBC 9.4  HGB 9.4*  HCT 30.9*  PLT 317    BMET  Recent Labs  03/01/16 0531 03/02/16 0420  NA 135 134*  K 3.7 3.7  CL 100* 101  CO2 25 26  GLUCOSE 152* 140*  BUN 16 16  CREATININE 1.41* 1.25*  CALCIUM 9.3 9.3   PT/INR No results for input(s): LABPROT, INR in the last 72 hours.  No results for input(s): AST, ALT, ALKPHOS, BILITOT, PROT, ALBUMIN in the last 168 hours.   Lipase  No results found for: LIPASE   Studies/Results: Dg Chest Port 1 View  02/29/2016  CLINICAL DATA:  Hypoxemia, shortness of breath EXAM: PORTABLE CHEST 1 VIEW COMPARISON:  02/28/2016 FINDINGS: Low lung volumes. Increased interstitial markings, favored to reflect mild interstitial edema. Superimposed left lower lobe pneumonia is not excluded. Cardiomegaly. IMPRESSION: Cardiomegaly with suspected mild interstitial edema. Superimposed left lower lobe pneumonia is not excluded. Electronically Signed   By: Julian Hy M.D.   On:  02/29/2016 20:38    Medications: . acetaminophen  1,000 mg Oral Q6H  . amLODipine  10 mg Oral Daily  . bethanechol  10 mg Oral TID  . enoxaparin (LOVENOX) injection  40 mg Subcutaneous Q24H  . escitalopram  20 mg Oral Daily  . feeding supplement (GLUCERNA SHAKE)  237 mL Oral BID BM  . glimepiride  1 mg Oral QAC breakfast  . hydrochlorothiazide  25 mg Oral QODAY  . insulin aspart  0-20 Units Subcutaneous TID WC  . insulin detemir  20 Units Subcutaneous QHS  . levothyroxine  50 mcg Oral QAC breakfast  . methocarbamol (ROBAXIN)  IV  500 mg Intravenous Q8H  . tamsulosin  0.4 mg Oral QPC supper  . Vitamin D (Ergocalciferol)  50,000 Units Oral Q7 days   . sodium chloride 50 mL/hr at 03/02/16 0002    Hypertension Type II diabetes   Hypothyroid Depression  Assessment/Plan Incisional/Ventral hernia repair, 26 x 23 cm, 02/28/16, Dr. Judeth Horn  POD 3 Ileus - no flatus Chronic kidney disease - creatinine improving Urinary retention - foley in - day 2 flomax SOB- sats low 90's on 5 liters Colon FEN: full liquids/IV fluids  ID: preop only DVT: Lovenox   Plan:  I am not going to change anything, except get her walking in the halls.  She is independent at home.  Recheck labs and CXR in AM.         JENNINGS,WILLARD 03/02/2016  [phone]  She is uncomfortable in the room-too warm.  Otherwise feeling bloated.  Agree with Will Creig Hines assessment and plan above.   Kaylyn Lim, MD, FACS

## 2016-03-03 ENCOUNTER — Ambulatory Visit (HOSPITAL_COMMUNITY): Payer: Medicare Other

## 2016-03-03 DIAGNOSIS — E1122 Type 2 diabetes mellitus with diabetic chronic kidney disease: Secondary | ICD-10-CM | POA: Diagnosis present

## 2016-03-03 DIAGNOSIS — E785 Hyperlipidemia, unspecified: Secondary | ICD-10-CM | POA: Diagnosis present

## 2016-03-03 DIAGNOSIS — K560 Paralytic ileus: Secondary | ICD-10-CM | POA: Diagnosis present

## 2016-03-03 DIAGNOSIS — M810 Age-related osteoporosis without current pathological fracture: Secondary | ICD-10-CM | POA: Diagnosis present

## 2016-03-03 DIAGNOSIS — K432 Incisional hernia without obstruction or gangrene: Secondary | ICD-10-CM | POA: Diagnosis present

## 2016-03-03 DIAGNOSIS — Z888 Allergy status to other drugs, medicaments and biological substances status: Secondary | ICD-10-CM | POA: Diagnosis not present

## 2016-03-03 DIAGNOSIS — I129 Hypertensive chronic kidney disease with stage 1 through stage 4 chronic kidney disease, or unspecified chronic kidney disease: Secondary | ICD-10-CM | POA: Diagnosis present

## 2016-03-03 DIAGNOSIS — F329 Major depressive disorder, single episode, unspecified: Secondary | ICD-10-CM | POA: Diagnosis present

## 2016-03-03 DIAGNOSIS — K219 Gastro-esophageal reflux disease without esophagitis: Secondary | ICD-10-CM | POA: Diagnosis present

## 2016-03-03 DIAGNOSIS — F419 Anxiety disorder, unspecified: Secondary | ICD-10-CM | POA: Diagnosis present

## 2016-03-03 DIAGNOSIS — K430 Incisional hernia with obstruction, without gangrene: Secondary | ICD-10-CM | POA: Diagnosis present

## 2016-03-03 DIAGNOSIS — E559 Vitamin D deficiency, unspecified: Secondary | ICD-10-CM | POA: Diagnosis present

## 2016-03-03 DIAGNOSIS — E039 Hypothyroidism, unspecified: Secondary | ICD-10-CM | POA: Diagnosis present

## 2016-03-03 DIAGNOSIS — N189 Chronic kidney disease, unspecified: Secondary | ICD-10-CM | POA: Diagnosis present

## 2016-03-03 DIAGNOSIS — Z96652 Presence of left artificial knee joint: Secondary | ICD-10-CM | POA: Diagnosis present

## 2016-03-03 DIAGNOSIS — R0602 Shortness of breath: Secondary | ICD-10-CM | POA: Diagnosis not present

## 2016-03-03 DIAGNOSIS — R339 Retention of urine, unspecified: Secondary | ICD-10-CM | POA: Diagnosis present

## 2016-03-03 LAB — BASIC METABOLIC PANEL
Anion gap: 5 (ref 5–15)
BUN: 11 mg/dL (ref 6–20)
CHLORIDE: 104 mmol/L (ref 101–111)
CO2: 27 mmol/L (ref 22–32)
Calcium: 9.6 mg/dL (ref 8.9–10.3)
Creatinine, Ser: 1 mg/dL (ref 0.44–1.00)
GFR calc non Af Amer: 55 mL/min — ABNORMAL LOW (ref >60)
Glucose, Bld: 88 mg/dL (ref 65–99)
POTASSIUM: 3.7 mmol/L (ref 3.5–5.1)
SODIUM: 136 mmol/L (ref 135–145)

## 2016-03-03 LAB — GLUCOSE, CAPILLARY
GLUCOSE-CAPILLARY: 107 mg/dL — AB (ref 65–99)
Glucose-Capillary: 94 mg/dL (ref 65–99)
Glucose-Capillary: 122 mg/dL — ABNORMAL HIGH (ref 65–99)
Glucose-Capillary: 118 mg/dL — ABNORMAL HIGH (ref 65–99)
Glucose-Capillary: 215 mg/dL — ABNORMAL HIGH (ref 65–99)

## 2016-03-03 LAB — CBC
HEMATOCRIT: 29.8 % — AB (ref 36.0–46.0)
Hemoglobin: 9.2 g/dL — ABNORMAL LOW (ref 12.0–15.0)
MCH: 27.6 pg (ref 26.0–34.0)
MCHC: 30.9 g/dL (ref 30.0–36.0)
MCV: 89.5 fL (ref 78.0–100.0)
Platelets: 367 10*3/uL (ref 150–400)
RBC: 3.33 MIL/uL — ABNORMAL LOW (ref 3.87–5.11)
RDW: 15.1 % (ref 11.5–15.5)
WBC: 8.1 10*3/uL (ref 4.0–10.5)

## 2016-03-03 MED ORDER — TRAMADOL HCL 50 MG PO TABS
100.0000 mg | ORAL_TABLET | Freq: Four times a day (QID) | ORAL | Status: DC
  Administered 2016-03-03 – 2016-03-04 (×4): 100 mg via ORAL
  Filled 2016-03-03 (×4): qty 2

## 2016-03-03 MED ORDER — BISACODYL 10 MG RE SUPP
10.0000 mg | Freq: Once | RECTAL | Status: AC
  Administered 2016-03-03: 10 mg via RECTAL
  Filled 2016-03-03: qty 1

## 2016-03-03 MED ORDER — HYDROMORPHONE HCL 1 MG/ML IJ SOLN
0.5000 mg | INTRAMUSCULAR | Status: DC | PRN
  Administered 2016-03-03: 2 mg via INTRAVENOUS
  Administered 2016-03-03 – 2016-03-04 (×2): 1 mg via INTRAVENOUS
  Filled 2016-03-03: qty 2
  Filled 2016-03-03 (×2): qty 1

## 2016-03-03 NOTE — Progress Notes (Signed)
Upon getting ready to administer patient a suppository she explained to me that she has a history of chronic shingles; which she can feel herself having an outbreak and that it happens under stressful events. Patient stated she has current symptoms of itching, redness, and discomfort on her buttock. Notified AD Blanch Media and MD Hulen Skains. Patient placed on precautions. Will continue to monitor until further notice.

## 2016-03-03 NOTE — Progress Notes (Signed)
GS Progress Note Subjective: Still with lots of pain, but passing flatus  Objective: Vital signs in last 24 hours: Temp:  [98.1 F (36.7 C)-98.4 F (36.9 C)] 98.1 F (36.7 C) (07/10 0546) Pulse Rate:  [66-84] 83 (07/10 0546) BP: (105-131)/(52-75) 131/68 mmHg (07/10 0546) SpO2:  [86 %-90 %] 86 % (07/10 0546) Last BM Date: 02/27/16  Intake/Output from previous day: 07/09 0701 - 07/10 0700 In: 800 [I.V.:800] Out: 1775 [Urine:1775] Intake/Output this shift: Total I/O In: 360 [P.O.:360] Out: -   Lungs: Clear.  CXR much better..  Abd: Soft, Excellent bowel sounds.  Extremities: No changes  Neuro: Intact  Lab Results: CBC   Recent Labs  03/02/16 0420 03/03/16 0458  WBC 9.4 8.1  HGB 9.4* 9.2*  HCT 30.9* 29.8*  PLT 317 367   BMET  Recent Labs  03/02/16 0420 03/03/16 0458  NA 134* 136  K 3.7 3.7  CL 101 104  CO2 26 27  GLUCOSE 140* 88  BUN 16 11  CREATININE 1.25* 1.00  CALCIUM 9.3 9.6   PT/INR No results for input(s): LABPROT, INR in the last 72 hours. ABG No results for input(s): PHART, HCO3 in the last 72 hours.  Invalid input(s): PCO2, PO2  Studies/Results: No results found.  Anti-infectives: Anti-infectives    Start     Dose/Rate Route Frequency Ordered Stop   02/28/16 0812  polymyxin B 500,000 Units, bacitracin 50,000 Units in sodium chloride irrigation 0.9 % 500 mL irrigation  Status:  Discontinued       As needed 02/28/16 0813 02/28/16 0946   02/28/16 0700  ceFAZolin (ANCEF) IVPB 2g/100 mL premix     2 g 200 mL/hr over 30 Minutes Intravenous To ShortStay Surgical 02/27/16 1133 02/28/16 0756      Assessment/Plan: s/p Procedure(s): LAPAROSCOPIC HERNIA REPAIR WITH MESH INSERTION OF MESH Dulcolax suppository  Plan for SNF.     Kathryne Eriksson. Dahlia Bailiff, MD, FACS [phone] [facility] 03/03/2016

## 2016-03-04 LAB — GLUCOSE, CAPILLARY
GLUCOSE-CAPILLARY: 104 mg/dL — AB (ref 65–99)
GLUCOSE-CAPILLARY: 152 mg/dL — AB (ref 65–99)
Glucose-Capillary: 74 mg/dL (ref 65–99)
Glucose-Capillary: 124 mg/dL — ABNORMAL HIGH (ref 65–99)

## 2016-03-04 MED ORDER — METHOCARBAMOL 500 MG PO TABS: 500.0000 mg | ORAL_TABLET | Freq: Four times a day (QID) | ORAL | Status: DC | PRN

## 2016-03-04 MED ORDER — ZOLPIDEM TARTRATE 5 MG PO TABS: 5.0000 mg | ORAL_TABLET | Freq: Every evening | ORAL | Status: DC | PRN

## 2016-03-04 MED ORDER — BISACODYL 10 MG RE SUPP
10.0000 mg | Freq: Every day | RECTAL | Status: DC | PRN
Start: 2016-03-04 — End: 2016-04-08

## 2016-03-04 MED ORDER — OXYCODONE HCL 10 MG PO TABS: 5.0000 mg | ORAL_TABLET | Freq: Four times a day (QID) | ORAL | Status: DC | PRN

## 2016-03-04 MED ORDER — SODIUM CHLORIDE 0.9 % IV SOLN: 500.0000 mg | Freq: Three times a day (TID) | INTRAVENOUS | Status: DC | PRN

## 2016-03-04 NOTE — Discharge Summary (Signed)
Physician Discharge Summary  Patient ID: April Franco MRN: [id number] DOB/AGE: [birth year] 75 y.o.  Admit date: 02/28/2016 Discharge date: 03/04/2016  Admission Diagnoses:  Discharge Diagnoses:  Active Problems:   Incisional hernia   Adynamic ileus [facility])   Discharged Condition: good  Hospital Course: Patient admitted after laparoscopic ventral hernia repair.  Postoperatively had problems with pain control,  Ileus, and hypoxemia.  Consults: None  Significant Diagnostic Studies: labs: cbc and Bmet and radiology: CXR: atelectasis bilaterally and KUB: adynamic ileus  Treatments: IV hydration, analgesia: Dilaudid and tramadol and oxycodone, insulin: regular and Lantus, respiratory therapy: O2 and albuterol/atropine nebulizer, therapies: PT and surgery: laparoscopic ventral hernia repair  Discharge Exam: Blood pressure 141/69, pulse 75, temperature 98.1 F (36.7 C), temperature source Oral, resp. rate 18, height 5' 3.75" (1.619 m), weight 109.77 kg (242 lb), SpO2 93 %. General appearance: alert, cooperative, mild distress and morbidly obese Resp: clear to auscultation bilaterally and no wheezing currently GI: soft, non-tender; bowel sounds normal; no masses,  no organomegaly and no bowel movement but has had flatus Extremities: Homans sign is negative, no sign of DVT  Disposition: 06-Home-Health Care Svc     Medication List    ASK your doctor about these medications        ALPRAZolam 0.25 MG tablet  Commonly known as:  XANAX  Take 0.25 mg by mouth 2 (two) times daily as needed for anxiety.     amLODipine 10 MG tablet  Commonly known as:  NORVASC  Take 10 mg by mouth daily.     escitalopram 20 MG tablet  Commonly known as:  LEXAPRO  Take 20 mg by mouth daily.     glimepiride 1 MG tablet  Commonly known as:  AMARYL  Take 1 tablet by mouth every morning.     hydrochlorothiazide 25 MG tablet  Commonly known as:  HYDRODIURIL  Take 25 mg by mouth every other day.     levothyroxine 50 MCG tablet  Commonly known as:  SYNTHROID, LEVOTHROID  Take 50 mcg by mouth daily before breakfast.     lisinopril 20 MG tablet  Commonly known as:  PRINIVIL,ZESTRIL  Take 20 mg by mouth daily.     metFORMIN 500 MG tablet  Commonly known as:  GLUCOPHAGE  Take 1,000 mg by mouth 2 (two) times daily with a meal.     traMADol 50 MG tablet  Commonly known as:  ULTRAM  Take 50 mg by mouth every 6 (six) hours as needed (pain).     Vitamin D (Ergocalciferol) 50000 units Caps capsule  Commonly known as:  DRISDOL  Take 50,000 Units by mouth every 7 (seven) days. Takes on Sunday           Follow-up Information    Follow up with Judeth Horn, MD.   Specialty:  General Surgery   Why:  Call and make an appointment in 2-3 weeks.     Contact information:   [place] [address] [phone]       Follow up with Gennette Pac, MD.   Specialty:  Family Medicine   Why:  CAll and follow up for medical issues.   Contact information:   [place] [address] [phone]       Signed: Judeth Horn 03/04/2016, 10:34 AM

## 2016-03-04 NOTE — Progress Notes (Signed)
GS Progress Note Subjective: Patient doing much better today.  Able to go to the bathroom and shower on her own.  Objective: Vital signs in last 24 hours: Temp:  [98.1 F (36.7 C)-98.2 F (36.8 C)] 98.1 F (36.7 C) (07/11 0554) Pulse Rate:  [73-80] 75 (07/11 1010) Resp:  [18] 18 (07/11 0554) BP: (111-141)/(51-69) 141/69 mmHg (07/11 1010) SpO2:  [90 %-94 %] 93 % (07/11 0554) Last BM Date:  (passed mucus and small fecal matter)  Intake/Output from previous day: 07/10 0701 - 07/11 0700 In: 2271 [P.O.:960; I.V.:1211; IV Piggyback:100] Out: 2800 [Urine:2800] Intake/Output this shift: Total I/O In: -  Out: 400 [Urine:400]  Lungs: Clear.  Oxygen sturations 90% on room air  Abd: Soft, good bowel sounds.  Passing a lot of flatus  Extremities: No changes  Neuro: Intact  Lab Results: CBC   Recent Labs  03/02/16 0420 03/03/16 0458  WBC 9.4 8.1  HGB 9.4* 9.2*  HCT 30.9* 29.8*  PLT 317 367   BMET  Recent Labs  03/02/16 0420 03/03/16 0458  NA 134* 136  K 3.7 3.7  CL 101 104  CO2 26 27  GLUCOSE 140* 88  BUN 16 11  CREATININE 1.25* 1.00  CALCIUM 9.3 9.6   PT/INR No results for input(s): LABPROT, INR in the last 72 hours. ABG No results for input(s): PHART, HCO3 in the last 72 hours.  Invalid input(s): PCO2, PO2  Studies/Results: Dg Chest 2 View  03/03/2016  CLINICAL DATA:  Diabetes mellitus, hypertension, pneumonia, ileus EXAM: CHEST  2 VIEW COMPARISON:  02/29/2016 FINDINGS: Enlargement of cardiac silhouette. Mediastinal contours and pulmonary vascularity normal. Elevation LEFT diaphragm persists. Minimal LEFT basilar atelectasis with improved LEFT lower lobe opacity which could represent improved atelectasis or pneumonia. Remaining lungs clear. No definite pleural effusion or pneumothorax. Bones demineralized. IMPRESSION: Enlargement of cardiac silhouette. Minimal LEFT basilar atelectasis with overall improved atelectasis versus infiltrate LEFT lower lobe since  previous exam. Electronically Signed   By: Lavonia Dana M.D.   On: 03/03/2016 11:43   Dg Abd 1 View  03/03/2016  CLINICAL DATA:  Ileus, diabetes mellitus, hypertension EXAM: ABDOMEN - 1 VIEW COMPARISON:  None FINDINGS: Scattered gas and stool throughout colon to rectum. Small bowel gas pattern normal. No definite bowel dilatation or bowel wall thickening. Gaseous distention of stomach noted. Bones markedly demineralized. Opacity in the RIGHT mid abdomen could represent a large renal calculus or stool artifact 21 mm greatest size. Additional small potential calcification in RIGHT upper quadrant 5 mm diameter, likely external to the renal silhouette which is incompletely visualized, nonspecific. No additional urinary tract calcifications. IMPRESSION: Nonobstructive bowel gas pattern. Gaseous distention of stomach. Question artifact versus RIGHT renal calculus. Electronically Signed   By: Lavonia Dana M.D.   On: 03/03/2016 11:45    Anti-infectives: Anti-infectives    Start     Dose/Rate Route Frequency Ordered Stop   02/28/16 0812  polymyxin B 500,000 Units, bacitracin 50,000 Units in sodium chloride irrigation 0.9 % 500 mL irrigation  Status:  Discontinued       As needed 02/28/16 0813 02/28/16 0946   02/28/16 0700  ceFAZolin (ANCEF) IVPB 2g/100 mL premix     2 g 200 mL/hr over 30 Minutes Intravenous To ShortStay Surgical 02/27/16 1133 02/28/16 0756      Assessment/Plan: s/p Procedure(s): LAPAROSCOPIC HERNIA REPAIR WITH MESH INSERTION OF MESH Advance diet Try to discharge to home this afternoon.  LOS: 1 day    Kathryne Eriksson. Dahlia Bailiff, MD, FACS (  [phone] [facility] 03/04/2016

## 2016-03-04 NOTE — Discharge Instructions (Signed)
[facility] _______[facility] ° °UMBILICAL OR INGUINAL HERNIA REPAIR: POST OP INSTRUCTIONS ° °Always review your discharge instruction sheet given to you by the facility where your surgery was performed. °IF YOU HAVE DISABILITY OR FAMILY LEAVE FORMS, YOU MUST BRING THEM TO THE OFFICE FOR PROCESSING.   °DO NOT GIVE THEM TO YOUR DOCTOR. ° °1. A  prescription for pain medication may be given to you upon discharge.  Take your pain medication as prescribed, if needed.  If narcotic pain medicine is not needed, then you may take acetaminophen (Tylenol) or ibuprofen (Advil) as needed. °2. Take your usually prescribed medications unless otherwise directed. °3. If you need a refill on your pain medication, please contact your pharmacy.  They will contact our office to request authorization. Prescriptions will not be filled after 5 pm or on week-ends. °4. You should follow a light diet the first 24 hours after arrival home, such as soup and crackers, etc.  Be sure to include lots of fluids daily.  Resume your normal diet the day after surgery. °5. Most patients will experience some swelling and bruising around the umbilicus or in the groin and scrotum.  Ice packs and reclining will help.  Swelling and bruising can take several days to resolve.  °6. It is common to experience some constipation if taking pain medication after surgery.  Increasing fluid intake and taking a stool softener (such as Colace) will usually help or prevent this problem from occurring.  A mild laxative (Milk of Magnesia or Miralax) should be taken according to package directions if there are no bowel movements after 48 hours. °7. Unless discharge instructions indicate otherwise, you may remove your bandages 24-48 hours after surgery, and you may shower at that time.  You may have steri-strips (small skin tapes) in place directly over the incision.  These strips should be left on the skin for 7-10 days.  If your surgeon used skin glue on the  incision, you may shower in 24 hours.  The glue will flake off over the next 2-3 weeks.  Any sutures or staples will be removed at the office during your follow-up visit. °8. ACTIVITIES:  You may resume regular (light) daily activities beginning the next day--such as daily self-care, walking, climbing stairs--gradually increasing activities as tolerated.  You may have sexual intercourse when it is comfortable.  Refrain from any heavy lifting or straining until approved by your doctor. °a. You may drive when you are no longer taking prescription pain medication, you can comfortably wear a seatbelt, and you can safely maneuver your car and apply brakes. °b. RETURN TO WORK:  __________________________________________________________ °9. You should see your doctor in the office for a follow-up appointment approximately 2-3 weeks after your surgery.  Make sure that you call for this appointment within a day or two after you arrive home to insure a convenient appointment time. °10. OTHER INSTRUCTIONS:  __________________________________________________________________________________________________________________________________________________________________________________________  °WHEN TO CALL YOUR DOCTOR: °1. Fever over 101.0 °2. Inability to urinate °3. Nausea and/or vomiting °4. Extreme swelling or bruising °5. Continued bleeding from incision. °6. Increased pain, redness, or drainage from the incision ° °The clinic staff is available to answer your questions during regular business hours.  Please don’t hesitate to call and ask to speak to one of the nurses for clinical concerns.  If you have a medical emergency, go to the nearest emergency room or call 911.  A surgeon from [facility] is always on call at the hospital ° ° °  [address] [place], [place], [place]  [address] ?  [address], [place], [place]   [address] [phone] ? [phone] ? FAX [phone] Web site:  [email or web address]  Laparoscopic Ventral Hernia Repair, Care After Refer to this sheet in the next few weeks. These instructions provide you with information on caring for yourself after your procedure. Your caregiver may also give you more specific instructions. Your treatment has been planned according to current medical practices, but problems sometimes occur. Call your caregiver if you have any problems or questions after your procedure.  HOME CARE INSTRUCTIONS   Only take over-the-counter or prescription medicines as directed by your caregiver. If antibiotic medicines are prescribed, take them as directed. Finish them even if you start to feel better.  Always wash your hands before touching your abdomen.  Take your bandages (dressings) off after 48 hours or as directed by your caregiver. You may have skin adhesive strips or skin glue over the surgical cuts (incisions). Do not take the strips off or peel the glue off. These will fall off on their own.  Take showers once your caregiver approves. Until then, only take sponge baths. Do not take tub baths or go swimming until your caregiver approves.  Check your incision area every day for swelling, redness, warmth, and blood or fluid leaking from the incision. These are signs of infection. Wash your hands before you check.  Hold a pillow over your abdomen when you cough or sneeze to help ease pain.  Eat foods that are high in fiber, such as whole grains, fruits, and vegetables. Fiber helps prevent difficult bowel movements (constipation).  Drink enough fluids to keep your urine clear or pale yellow.  Rest and lessen activity for 4-5 days after the surgery. You may take short walks if your caregiver approves. Do not drive until approved by your caregiver.  Do not lift anything heavy, participate in sports, or have sexual intercourse for 6-8 weeks or until your caregiver approves.   Ask your caregiver when you can return to  work.  Keep all follow-up appointments. SEEK MEDICAL CARE IF:   You have pain even after taking pain medicine.  You have not had a bowel movement in 3 days.  You have cramps or are nauseous. SEEK IMMEDIATE MEDICAL CARE IF:   You have pain or swelling that is getting worse.  You have redness around your incisions.  Your incision is pulling apart.  You have blood or fluid leaking from your incisions.  You are vomiting.  You cannot pass urine. MAKE SURE YOU:   Understand these instructions.  Will watch your condition.  Will get help right away if you are not doing well or get worse.   This information is not intended to replace advice given to you by your health care provider. Make sure you discuss any questions you have with your health care provider.   Document Released: 07/28/2012 Document Revised: 09/01/2014 Document Reviewed: 07/28/2012 Elsevier Interactive Patient Education [employer].

## 2016-03-04 NOTE — Care Management Important Message (Signed)
Important Message  Patient Details  Name: April Franco MRN: [id number] Date of Birth: [birth year]   Medicare Important Message Given:  Yes    Nathen May 03/04/2016, 12:24 PM

## 2016-03-04 NOTE — Progress Notes (Signed)
Pt discharged to home.  Discharge instructions explained to pt.  Pt has no questions at the time of discharge.  Pt states she has all belongings.  IV removed.  Pt taken off floor via wheelchair by volunteer services.

## 2016-03-04 NOTE — Progress Notes (Signed)
Physical Therapy Treatment Patient Details Name: April Franco MRN: [id number] DOB: 07-12-1941 Today's Date: 03/04/2016    History of Present Illness S/P LAPAROSCOPIC HERNIA REPAIR WITH MESH on 02/28/16.  PMH significant for: DM, HTN, CKD, osteoporosis,anxiety, deperssion    PT Comments    Pt performed with improved ability and required decreased assistance.  Pt negotiated stairs and advanced gait distance.  Pt knows to use abdominal binder at home.  Pt requiring min assist for bed mobility only, likely needed assist before with bed mobility due to body habitus.  Informed supervising PT to update recommendations for home with supervision.  Pt will not require HHPT and educated patient to continue walking post abdominal surgery.    Follow Up Recommendations  Home health PT;Supervision/Assistance - 24 hour     Equipment Recommendations  None recommended by PT    Recommendations for Other Services       Precautions / Restrictions Precautions Precautions: Fall Precaution Comments: monitor sats, Required Braces or Orthoses: Other Brace/Splint Other Brace/Splint: abd. binder Restrictions Weight Bearing Restrictions: No    Mobility  Bed Mobility Overal bed mobility: Needs Assistance   Rolling: Min assist   Supine to sit: Min assist     General bed mobility comments: Required assist for rolling and elevation of trunk, able to advance feet to edge of bed.    Transfers Overall transfer level: Needs assistance Equipment used: Rolling walker (2 wheeled) Transfers: Sit to/from Stand Sit to Stand: Supervision         General transfer comment: Cues for hand placement to improve safety.    Ambulation/Gait Ambulation/Gait assistance: Supervision Ambulation Distance (Feet): 250 Feet Assistive device: Rolling walker (2 wheeled) Gait Pattern/deviations: Step-through pattern;Trunk flexed Gait velocity: slow   General Gait Details: Improved endurance, cues for forward gaze.      Stairs Stairs: Yes Stairs assistance: Supervision Stair Management: One rail Right;Step to pattern;Forwards Number of Stairs: 10 General stair comments: Cues for sequencing and hand placement.  simulated cane on R side during descent.  Educated patient on RW placement.    Wheelchair Mobility    Modified Rankin (Stroke Patients Only)       Balance Overall balance assessment: Needs assistance   Sitting balance-Leahy Scale: Fair       Standing balance-Leahy Scale: Fair                      Cognition Arousal/Alertness: Awake/alert Behavior During Therapy: WFL for tasks assessed/performed Overall Cognitive Status: Within Functional Limits for tasks assessed                      Exercises      General Comments        Pertinent Vitals/Pain Pain Assessment: 0-10 Pain Score: 6  Pain Location: abdomen Pain Descriptors / Indicators: Tightness;Grimacing;Operative site guarding Pain Intervention(s): Monitored during session;Repositioned    Home Living                      Prior Function            PT Goals (current goals can now be found in the care plan section) Acute Rehab PT Goals Patient Stated Goal: to go home when she is ready Potential to Achieve Goals: Good Progress towards PT goals: Progressing toward goals    Frequency  Min 3X/week    PT Plan Discharge plan needs to be updated    Co-evaluation  End of Session Equipment Utilized During Treatment: Other (comment);Gait belt Activity Tolerance: Patient limited by pain Patient left: in chair;with call bell/phone within reach;with family/visitor present;with chair alarm set     Time: 1031-1101 PT Time Calculation (min) (ACUTE ONLY): 30 min  Charges:  $Gait Training: 23-37 mins                    G Codes:      Cristela Blue 03-18-16, 11:23 AM  Governor Rooks, PTA pager [phone]

## 2016-03-04 NOTE — Clinical Social Work Note (Signed)
CSW received referral for SNF.  Case discussed with case manager, and plan is to discharge home.  CSW to sign off please re-consult if social work needs arise.  Jemario Poitras R. Meda Dudzinski, MSW, LCSWA [phone]  

## 2016-03-04 NOTE — Care Management Note (Signed)
Case Management Note  Patient Details  Name: April Franco MRN: [id number] Date of Birth: [birth year]  Subjective/Objective:                    Action/Plan:   Expected Discharge Date:  02/29/16               Expected Discharge Plan:  [place]  In-House Referral:     Discharge planning Services  CM Consult  Post Acute Care Choice:  Home Health Choice offered to:  Patient  DME Arranged:    DME Agency:     HH Arranged:  PT Ignacio:  [place]  Status of Service:  Completed, signed off  If discussed at [place] of Stay Meetings, dates discussed:    Additional Comments:  Marilu Favre, RN 03/04/2016, 1:25 PM

## 2016-03-19 ENCOUNTER — Encounter: Payer: Self-pay | Admitting: *Deleted
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
## 2016-04-08 ENCOUNTER — Encounter: Payer: Self-pay | Admitting: Cardiology

## 2016-04-08 ENCOUNTER — Ambulatory Visit (INDEPENDENT_AMBULATORY_CARE_PROVIDER_SITE_OTHER): Payer: Medicare Other | Admitting: Cardiology

## 2016-04-08 VITALS — BP 134/72 | HR 98 | Ht 63.75 in | Wt 215.4 lb

## 2016-04-08 DIAGNOSIS — I517 Cardiomegaly: Secondary | ICD-10-CM | POA: Diagnosis not present

## 2016-04-08 DIAGNOSIS — I10 Essential (primary) hypertension: Secondary | ICD-10-CM

## 2016-04-08 HISTORY — DX: Essential (primary) hypertension: I10

## 2016-04-08 NOTE — Patient Instructions (Signed)
Medication Instructions:  Your physician recommends that you continue on your current medications as directed. Please refer to the Current Medication list given to you today.   Labwork: None ordered   Testing/Procedures: Your physician has requested that you have an echocardiogram. Echocardiography is a painless test that uses sound waves to create images of your heart. It provides your doctor with information about the size and shape of your heart and how well your heart's chambers and valves are working. This procedure takes approximately one hour. There are no restrictions for this procedure.  Follow-Up: Your physician recommends that you schedule a follow-up appointment AS NEEDED with Dr. Radford Pax pending study results.  Any Other Special Instructions Will Be Listed Below (If Applicable).     If you need a refill on your cardiac medications before your next appointment, please call your pharmacy.

## 2016-05-05 ENCOUNTER — Encounter: Payer: Self-pay | Admitting: Cardiology

## 2016-05-05 ENCOUNTER — Ambulatory Visit (HOSPITAL_COMMUNITY): Payer: Medicare Other | Attending: Internal Medicine

## 2016-05-05 ENCOUNTER — Other Ambulatory Visit: Payer: Self-pay

## 2016-05-05 DIAGNOSIS — E119 Type 2 diabetes mellitus without complications: Secondary | ICD-10-CM | POA: Diagnosis not present

## 2016-05-05 DIAGNOSIS — I517 Cardiomegaly: Secondary | ICD-10-CM

## 2016-05-05 DIAGNOSIS — Z6837 Body mass index (BMI) 37.0-37.9, adult: Secondary | ICD-10-CM | POA: Diagnosis not present

## 2016-05-05 DIAGNOSIS — E785 Hyperlipidemia, unspecified: Secondary | ICD-10-CM | POA: Diagnosis not present

## 2016-05-05 DIAGNOSIS — I119 Hypertensive heart disease without heart failure: Secondary | ICD-10-CM | POA: Insufficient documentation

## 2016-05-05 DIAGNOSIS — I351 Nonrheumatic aortic (valve) insufficiency: Secondary | ICD-10-CM | POA: Diagnosis not present

## 2016-05-05 DIAGNOSIS — E669 Obesity, unspecified: Secondary | ICD-10-CM | POA: Insufficient documentation

## 2016-07-28 IMAGING — CR DG ABDOMEN 1V
2 series · 2 of 2 positions shown · non-contrast
Comparison: None

CLINICAL DATA: Ileus, diabetes mellitus, hypertension

EXAM:
ABDOMEN - 1 VIEW

[abdomen kub (1 of 2)]
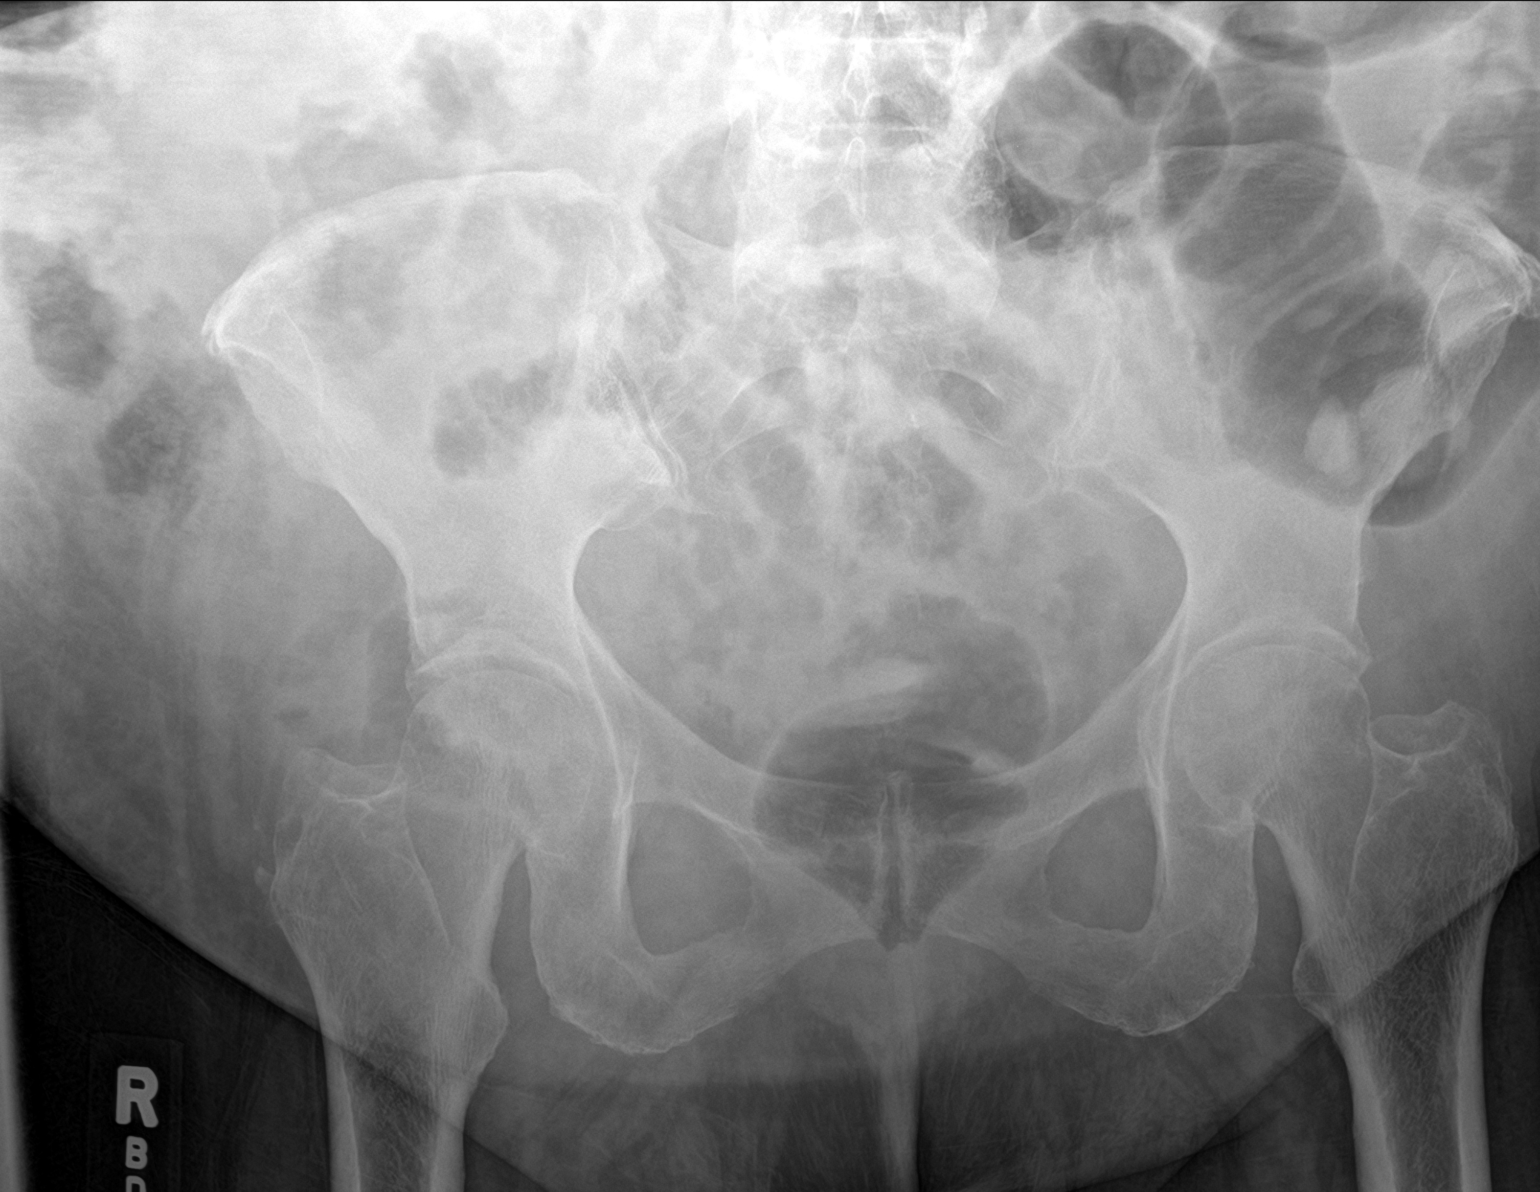

[abdomen kub (2 of 2)]
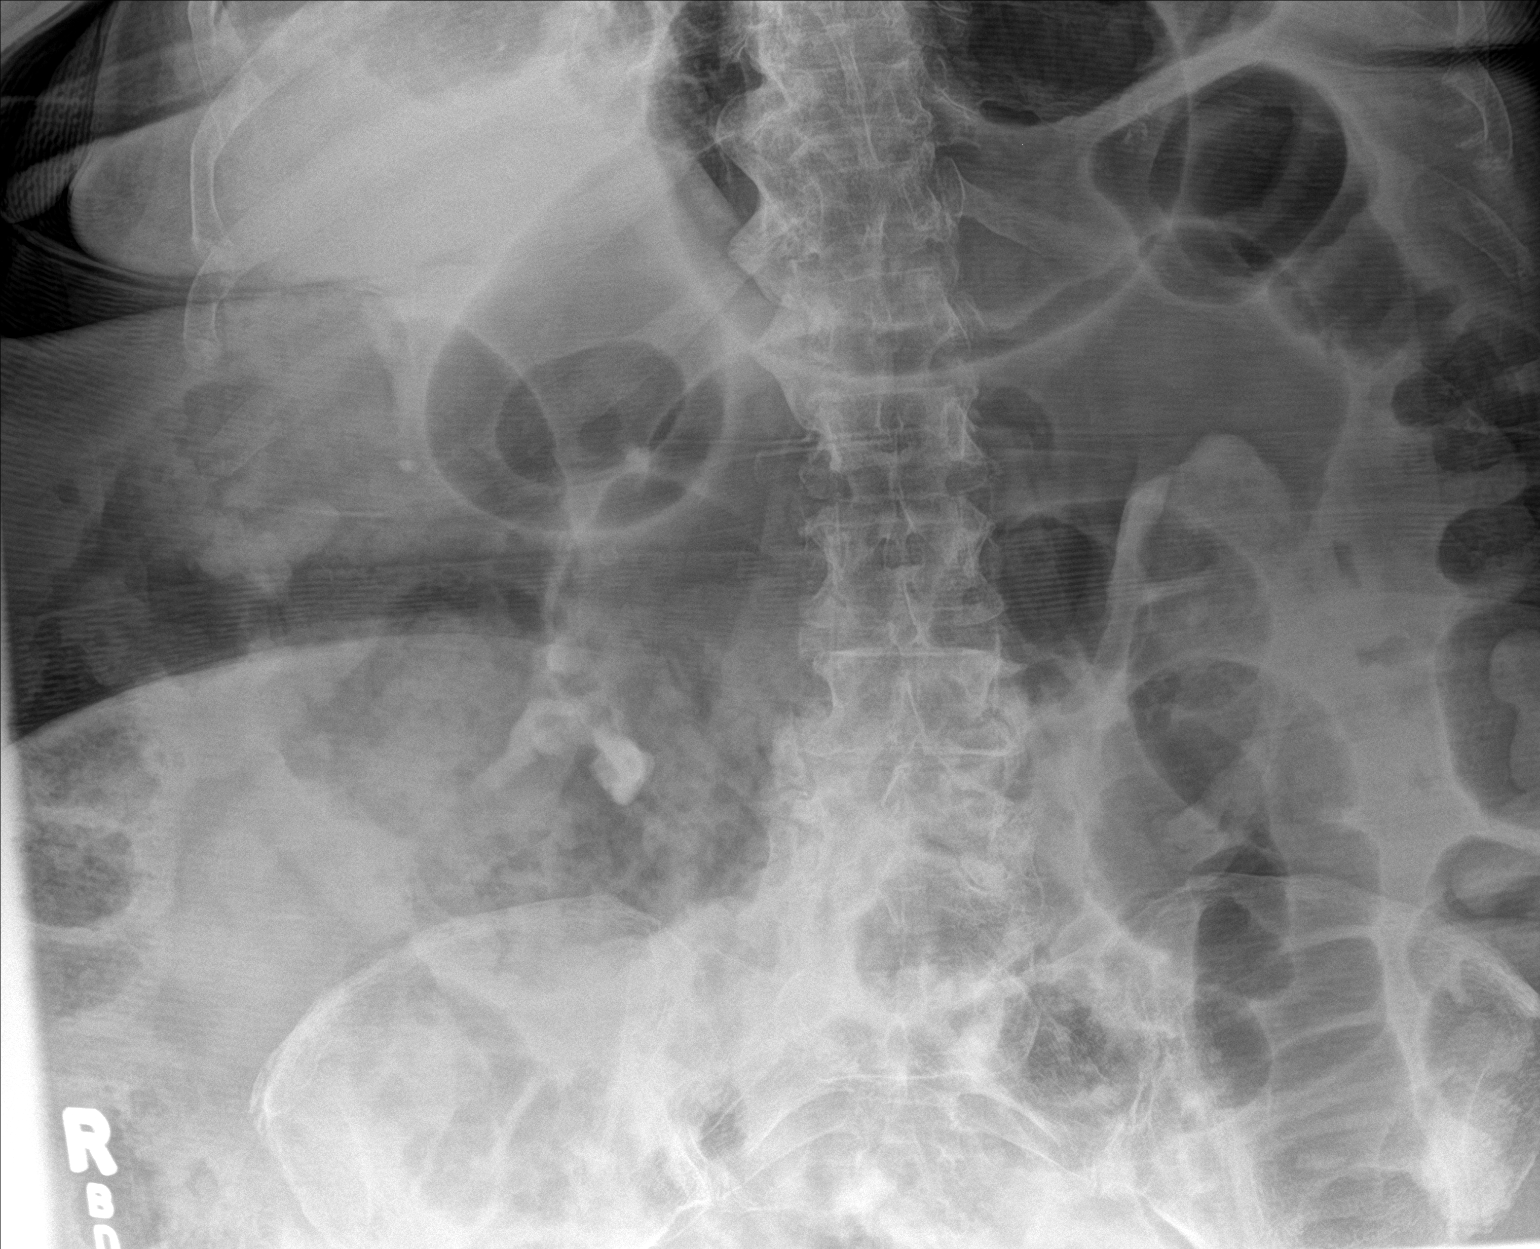

[2 of 2 positions shown; findings below may reference images not displayed]

FINDINGS: Scattered gas and stool throughout colon to rectum.

Small bowel gas pattern normal.

No definite bowel dilatation or bowel wall thickening.

Gaseous distention of stomach noted.

Bones markedly demineralized.

Opacity in the RIGHT mid abdomen could represent a large renal
calculus or stool artifact 21 mm greatest size.

Additional small potential calcification in RIGHT upper quadrant 5
mm diameter, likely external to the renal silhouette which is
incompletely visualized, nonspecific.

No additional urinary tract calcifications.
IMPRESSION: Nonobstructive bowel gas pattern.

Gaseous distention of stomach.

Question artifact versus RIGHT renal calculus.

## 2016-07-28 IMAGING — CR DG CHEST 2V
2 series · 2 of 2 positions shown · non-contrast
Comparison: 02/29/2016

CLINICAL DATA: Diabetes mellitus, hypertension, pneumonia, ileus

EXAM:
CHEST  2 VIEW

[chest lat]
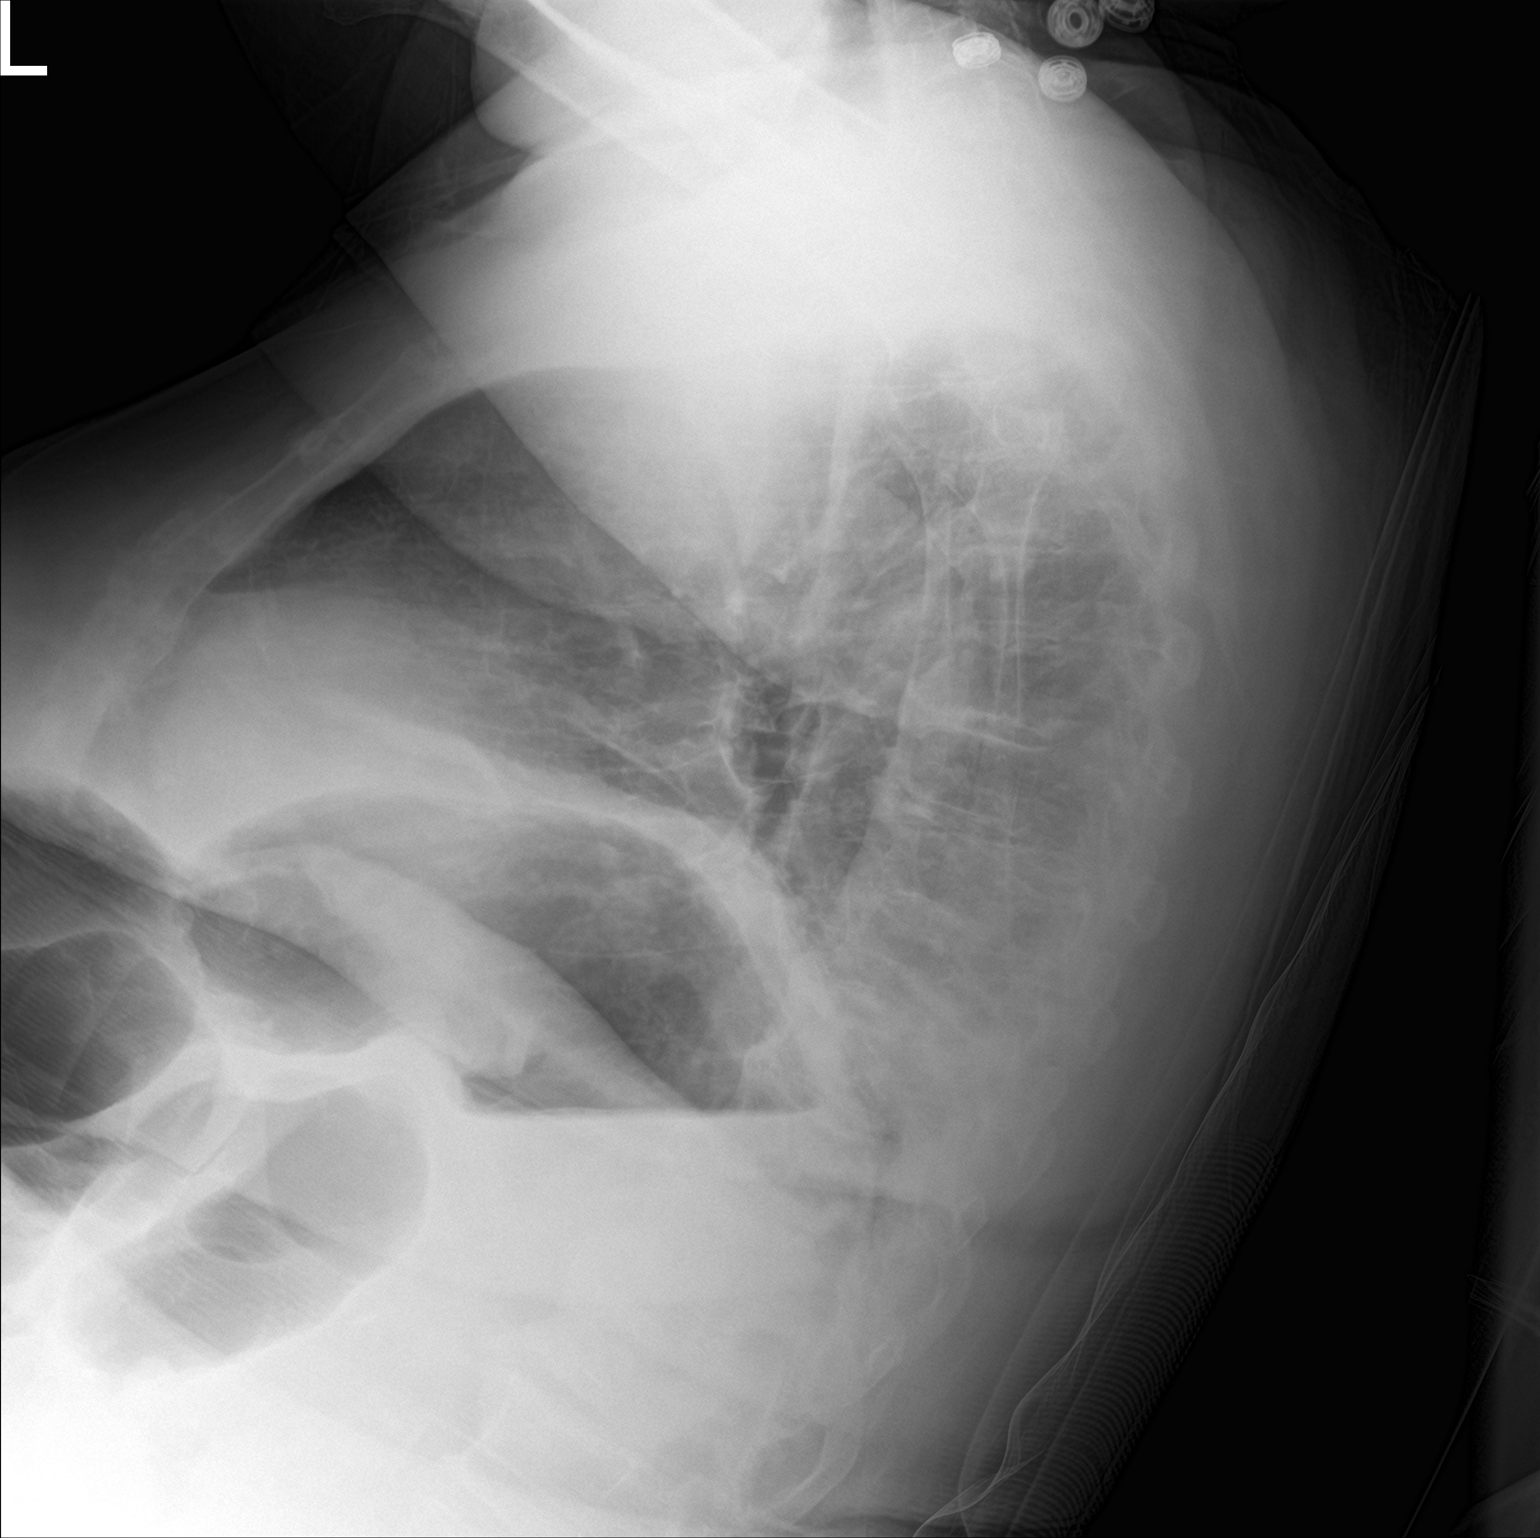

[chest ap]
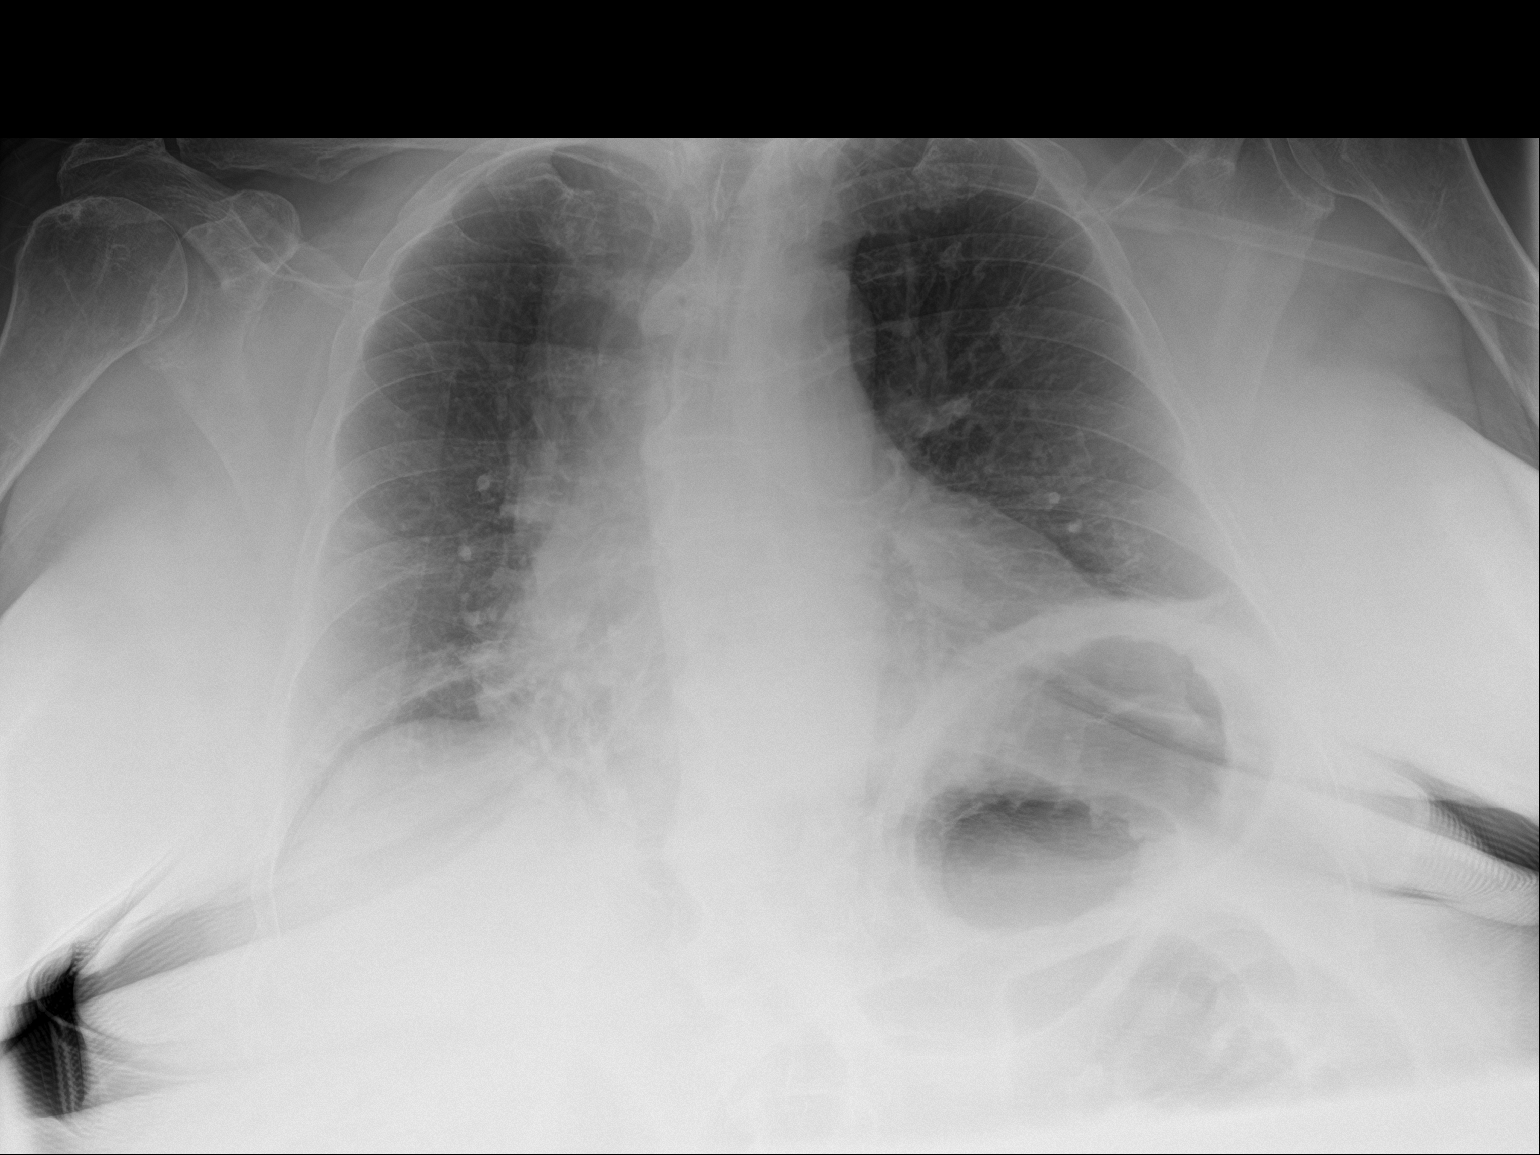

[2 of 2 positions shown; findings below may reference images not displayed]

FINDINGS: Enlargement of cardiac silhouette.

Mediastinal contours and pulmonary vascularity normal.

Elevation LEFT diaphragm persists.

Minimal LEFT basilar atelectasis with improved LEFT lower lobe
opacity which could represent improved atelectasis or pneumonia.

Remaining lungs clear.

No definite pleural effusion or pneumothorax.

Bones demineralized.
IMPRESSION: Enlargement of cardiac silhouette.

Minimal LEFT basilar atelectasis with overall improved atelectasis
versus infiltrate LEFT lower lobe since previous exam.

## 2018-03-10 ENCOUNTER — Other Ambulatory Visit: Payer: Self-pay | Admitting: Family Medicine

## 2018-03-10 DIAGNOSIS — R5381 Other malaise: Secondary | ICD-10-CM

## 2018-04-01 ENCOUNTER — Other Ambulatory Visit: Payer: Self-pay | Admitting: Family Medicine

## 2018-04-01 DIAGNOSIS — M8580 Other specified disorders of bone density and structure, unspecified site: Secondary | ICD-10-CM

## 2022-03-12 ENCOUNTER — Other Ambulatory Visit: Payer: Self-pay | Admitting: Family Medicine

## 2022-03-12 DIAGNOSIS — Z1231 Encounter for screening mammogram for malignant neoplasm of breast: Secondary | ICD-10-CM

## 2022-03-12 DIAGNOSIS — M810 Age-related osteoporosis without current pathological fracture: Secondary | ICD-10-CM

## 2022-04-03 ENCOUNTER — Emergency Department (HOSPITAL_COMMUNITY): Payer: Medicare PPO

## 2022-04-03 ENCOUNTER — Encounter (HOSPITAL_COMMUNITY): Payer: Self-pay

## 2022-04-03 ENCOUNTER — Other Ambulatory Visit: Payer: Self-pay

## 2022-04-03 ENCOUNTER — Emergency Department (HOSPITAL_COMMUNITY)
Admission: EM | Admit: 2022-04-03 | Discharge: 2022-04-03 | Disposition: A | Discharge status: Home / Self Care | Payer: Medicare PPO | Attending: Emergency Medicine | Admitting: Emergency Medicine

## 2022-04-03 DIAGNOSIS — I10 Essential (primary) hypertension: Secondary | ICD-10-CM | POA: Diagnosis not present

## 2022-04-03 DIAGNOSIS — M545 Low back pain, unspecified: Secondary | ICD-10-CM | POA: Diagnosis not present

## 2022-04-03 DIAGNOSIS — W01198A Fall on same level from slipping, tripping and stumbling with subsequent striking against other object, initial encounter: Secondary | ICD-10-CM | POA: Diagnosis not present

## 2022-04-03 DIAGNOSIS — S0990XA Unspecified injury of head, initial encounter: Secondary | ICD-10-CM | POA: Diagnosis present

## 2022-04-03 DIAGNOSIS — W19XXXA Unspecified fall, initial encounter: Secondary | ICD-10-CM

## 2022-04-03 DIAGNOSIS — Y9301 Activity, walking, marching and hiking: Secondary | ICD-10-CM | POA: Insufficient documentation

## 2022-04-03 DIAGNOSIS — S0003XA Contusion of scalp, initial encounter: Secondary | ICD-10-CM | POA: Insufficient documentation

## 2022-04-03 DIAGNOSIS — Y9289 Other specified places as the place of occurrence of the external cause: Secondary | ICD-10-CM | POA: Diagnosis not present

## 2022-04-03 DIAGNOSIS — Z79899 Other long term (current) drug therapy: Secondary | ICD-10-CM | POA: Diagnosis not present

## 2022-04-03 DIAGNOSIS — E039 Hypothyroidism, unspecified: Secondary | ICD-10-CM | POA: Diagnosis not present

## 2022-04-03 DIAGNOSIS — Z7984 Long term (current) use of oral hypoglycemic drugs: Secondary | ICD-10-CM | POA: Diagnosis not present

## 2022-04-03 DIAGNOSIS — E119 Type 2 diabetes mellitus without complications: Secondary | ICD-10-CM | POA: Insufficient documentation

## 2022-04-03 LAB — CBG MONITORING, ED: Glucose-Capillary: 87 mg/dL (ref 70–99)

## 2022-04-03 MED ORDER — TRAMADOL HCL 50 MG PO TABS: 50.0000 mg | ORAL_TABLET | Freq: Four times a day (QID) | ORAL | 0 refills | Status: AC | PRN

## 2022-04-03 MED ORDER — TRAMADOL HCL 50 MG PO TABS: 50.0000 mg | ORAL_TABLET | Freq: Four times a day (QID) | ORAL | 0 refills | Status: DC | PRN

## 2022-04-03 MED ORDER — TRAMADOL HCL 50 MG PO TABS
50.0000 mg | ORAL_TABLET | Freq: Once | ORAL | Status: AC
  Administered 2022-04-03: 50 mg via ORAL
  Filled 2022-04-03: qty 1
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
## 2022-04-03 NOTE — ED Provider Notes (Signed)
[facility] DEPT Provider Note   CSN: [id number] Arrival date & time: 04/03/22  1407     History  Chief Complaint  Patient presents with   Fall   Head Injury    April Franco is a 81 y.o. female.  The history is provided by the patient and medical records. No language interpreter was used.  Fall  Head Injury    April Franco is a 81 yo F w/ hx of falls, T2DM, HTN presents to the ED after a fall. Around 1pm, she slipped on a rug, fell backwards hitting her buttocks first then the back of her head subsequently. At the time of injury, she denies LOC, dizziness, vomiting, amnesia. At her interview, she says she tried to stand up briefly but had to immediately sit down because the "room was spinning". Endorses feeling like she needs to vomit and having a lot of discomfort around her jaw, saying she is unable to closer her teeth together. She is also having some neck pain, headache, and tenderness at the back of her head. Patient says she is typically ambulates w/o assistance at baseline. She denies chest pain, SOB, pain in the extremities d/t fall, hip pain, back pain, or abdominal pain. She is not on blood thinners.  Home Medications Prior to Admission medications   Medication Sig Start Date End Date Taking? Authorizing Provider  ALPRAZolam (XANAX) 0.25 MG tablet Take 0.25 mg by mouth 2 (two) times daily as needed for anxiety.    [provider]  amLODipine (NORVASC) 10 MG tablet Take 10 mg by mouth daily.    [provider]  escitalopram (LEXAPRO) 20 MG tablet Take 20 mg by mouth daily.    [provider]  glimepiride (AMARYL) 1 MG tablet Take 1 tablet by mouth every morning. 11/17/15   [provider]  hydrochlorothiazide (HYDRODIURIL) 25 MG tablet Take 25 mg by mouth every other day.    [provider]  levothyroxine (SYNTHROID, LEVOTHROID) 50 MCG tablet Take 50 mcg by mouth daily before breakfast.    [provider]  lisinopril (PRINIVIL,ZESTRIL) 20 MG tablet Take 20 mg by mouth daily.    [provider]  metFORMIN (GLUCOPHAGE) 500 MG tablet Take 1,000 mg by mouth 2 (two) times daily with a meal.    [provider]  traMADol (ULTRAM) 50 MG tablet Take 50 mg by mouth every 6 (six) hours as needed (pain).    [provider]  Vitamin D, Ergocalciferol, (DRISDOL) 50000 units CAPS capsule Take 50,000 Units by mouth every 7 (seven) days. Takes on Sunday    [provider]      Allergies    Byetta 10 mcg pen [exenatide], Nsaids, Other, Lipitor [atorvastatin], Lovastatin, Pravachol [pravastatin sodium], and Simvastatin    Review of Systems   Review of Systems  All other systems reviewed and are negative.   Physical Exam Updated Vital Signs BP (!) 180/78 (BP Location: Left Arm)   Pulse 62   Temp 97.7 F (36.5 C) (Oral)   Resp 18   Ht 5' 3.5" (1.613 m)   Wt 87.1 kg   SpO2 98%   BMI 33.48 kg/m  Physical Exam Vitals and nursing note reviewed.  Constitutional:      General: She is not in acute distress.    Appearance: She is well-developed.  HENT:     Head: Normocephalic.     Comments: Tenderness to occiput of scalp with faint ecchymosis noted.  No crepitus.  Mouth/Throat:     Comments: Tenderness to left angle of mandible on palpation without deformity Eyes:     Extraocular Movements: Extraocular movements intact.     Conjunctiva/sclera: Conjunctivae normal.     Pupils: Pupils are equal, round, and reactive to light.  Cardiovascular:     Rate and Rhythm: Normal rate and regular rhythm.     Pulses: Normal pulses.     Heart sounds: Normal heart sounds.  Pulmonary:     Effort: Pulmonary effort is normal.  Abdominal:     Palpations: Abdomen is soft.     Tenderness: There is no abdominal tenderness.  Musculoskeletal:        General: Tenderness (Mild tenderness to lumbar paralumbar spine spinal muscle) present.     Cervical back: Neck supple.   Skin:    Findings: No rash.  Neurological:     Mental Status: She is alert and oriented to person, place, and time.  Psychiatric:        Mood and Affect: Mood normal.     ED Results / Procedures / Treatments   Labs (all labs ordered are listed, but only abnormal results are displayed) Labs Reviewed  CBG MONITORING, ED    EKG None  Radiology CT Cervical Spine Wo Contrast  Result Date: 04/03/2022 CLINICAL DATA:  Polytrauma, blunt.  Fall. EXAM: CT CERVICAL SPINE WITHOUT CONTRAST TECHNIQUE: Multidetector CT imaging of the cervical spine was performed without intravenous contrast. Multiplanar CT image reconstructions were also generated. RADIATION DOSE REDUCTION: This exam was performed according to the departmental dose-optimization program which includes automated exposure control, adjustment of the mA and/or kV according to patient size and/or use of iterative reconstruction technique. COMPARISON:  None Available. FINDINGS: Alignment: No subluxation. Skull base and vertebrae: No acute fracture. No primary bone lesion or focal pathologic process. Soft tissues and spinal canal: No prevertebral fluid or swelling. No visible canal hematoma. Disc levels: Advanced degenerative disc disease most pronounced at C5-6 and C6-7. Diffuse advanced degenerative facet disease bilaterally. Upper chest: No acute findings Other: None IMPRESSION: Degenerative disc and facet disease.  No acute bony abnormality. Electronically Signed   By: Rolm Baptise M.D.   On: 04/03/2022 17:32   CT Maxillofacial Wo Contrast  Result Date: 04/03/2022 CLINICAL DATA:  Head trauma, moderate-severe.  Fall. EXAM: CT MAXILLOFACIAL WITHOUT CONTRAST TECHNIQUE: Multidetector CT imaging of the maxillofacial structures was performed. Multiplanar CT image reconstructions were also generated. RADIATION DOSE REDUCTION: This exam was performed according to the departmental dose-optimization program which includes automated exposure control,  adjustment of the mA and/or kV according to patient size and/or use of iterative reconstruction technique. COMPARISON:  None Available. FINDINGS: Osseous: No fracture or mandibular dislocation. No destructive process. Orbits: Negative. No traumatic or inflammatory finding. Sinuses: Clear Soft tissues: Negative Limited intracranial: See head CT report IMPRESSION: No facial or orbital fracture. Electronically Signed   By: Rolm Baptise M.D.   On: 04/03/2022 17:31   CT Head Wo Contrast  Result Date: 04/03/2022 CLINICAL DATA:  Head trauma, moderate-severe.  Fall. EXAM: CT HEAD WITHOUT CONTRAST TECHNIQUE: Contiguous axial images were obtained from the base of the skull through the vertex without intravenous contrast. RADIATION DOSE REDUCTION: This exam was performed according to the departmental dose-optimization program which includes automated exposure control, adjustment of the mA and/or kV according to patient size and/or use of iterative reconstruction technique. COMPARISON:  None Available. FINDINGS: Brain: There is atrophy and chronic small vessel disease changes. Old left thalamic lacunar infarct. No acute  intracranial abnormality. Specifically, no hemorrhage, hydrocephalus, mass lesion, acute infarction, or significant intracranial injury. Vascular: No hyperdense vessel or unexpected calcification. Skull: No acute calvarial abnormality. Sinuses/Orbits: No acute findings Other: Soft tissue swelling in the high left parietal region. IMPRESSION: Atrophy, chronic microvascular disease. No acute intracranial abnormality. Old left thalamic lacunar infarct. Electronically Signed   By: Rolm Baptise M.D.   On: 04/03/2022 17:30    Procedures Procedures    Medications Ordered in ED Medications  traMADol (ULTRAM) tablet 50 mg (50 mg Oral Given 04/03/22 1617)    ED Course/ Medical Decision Making/ A&P                           Medical Decision Making Amount and/or Complexity of Data Reviewed Radiology:  ordered.  Risk Prescription drug management.   BP (!) 180/78 (BP Location: Left Arm)   Pulse 62   Temp 97.7 F (36.5 C) (Oral)   Resp 18   Ht 5' 3.5" (1.613 m)   Wt 87.1 kg   SpO2 98%   BMI 33.48 kg/m   3:22 PM This is a 81 year old female significant history of hypertension, hyperlipidemia, hypothyroid, diabetes, anxiety, was brought here via EMS from home for evaluation of a recent fall.  EMS report patient was walking out to the garage and accidentally slipped on a rug.  She fell backward striking her head against the ground.  She does not endorse any loss of consciousness but does endorse pain to the back of her head and neck.  A c-collar was placed.  Patient is not on any blood thinner medication.  Patient does endorse some dizziness upon standing.  She also complaining of some left jaw pain and felt that her jaw is not lining up like normal.  She also report hitting her bottom against the ground but denies any significant pain there.  She denies any precipitating symptoms.  Denies any chest pain shortness of breath abdominal pain or dysuria.  On exam, patient is sitting in wheelchair appears to be in no acute discomfort.  She does have tenderness to the occiput of scalp with faint ecchymosis noted without any crepitus.  She has tenderness to left angle of mandible but able to open and close her jaw without difficulty.  She does not have any midface tenderness.  No raccoon's eyes, no Battle sign.  No significant cervical spine tenderness to.  She has some mild lumbosacral tenderness on palpation but no bruising noted.  She is mentating appropriately.  Given her age, will obtain head and maxillofacial CT along with cervical spine CT.  Patient initially came in with a c-collar but she removed it by herself stating that it causes her great discomfort.  After removal of the collar patient reported feeling much better.  She does not have any significant midline spine tenderness at the  cervical region therefore my suspicion for cervical fracture is low.  For pain medication patient request for tramadol, tramadol given.  6:06 PM Normal CBG, head maxillofacial and cervical spine CT without any acute finding.  Dizziness did improve. Patient received food to eat and drink.  At this time she is stable to be discharged home with symptomatic care.  Return precaution given.  This patient presents to the ED for concern of fall, this involves an extensive number of treatment options, and is a complaint that carries with it a high risk of complications and morbidity.  The differential diagnosis includes mechanical fall,  syncope, stroke, vertigo  Co morbidities that complicate the patient evaluation recurrent falls Additional history obtained:  Additional history obtained from pt and husband External records from outside source obtained and reviewed including EMR including prior labs and imaging  Lab Tests:  I Ordered, and personally interpreted labs.  The pertinent results include:  as above  Imaging Studies ordered:  I ordered imaging studies including CT of head, maxillofacial, cspine  I independently visualized and interpreted imaging which showed no acute finding I agree with the radiologist interpretation  Cardiac Monitoring:  The patient was maintained on a cardiac monitor.  I personally viewed and interpreted the cardiac monitored which showed an underlying rhythm of: NSR  Medicines ordered and prescription drug management:  I ordered medication including tramadol  for headache Reevaluation of the patient after these medicines showed that the patient improved I have reviewed the patients home medicines and have made adjustments as needed  Test Considered: as above  Critical Interventions: pain medication  Consultations Obtained:  I requested consultation with the attending Dr. Ernesto Rutherford,  and discussed lab and imaging findings as well as pertinent plan - they  recommend: outpt f/u  Problem List / ED Course: fall  Reevaluation:  After the interventions noted above, I reevaluated the patient and found that they have :improved  Social Determinants of Health: none  Dispostion:  After consideration of the diagnostic results and the patients response to treatment, I feel that the patent would benefit from outpt f/u.         Final Clinical Impression(s) / ED Diagnoses Final diagnoses:  Fall, initial encounter  Contusion of scalp, initial encounter    Rx / DC Orders ED Discharge Orders          Ordered    traMADol (ULTRAM) 50 MG tablet  Every 6 hours PRN        04/03/22 1812              Domenic Moras, PA-C 04/03/22 1814    Lluveras, Norman Herrlich, DO 04/04/22 1457

## 2022-04-03 NOTE — ED Triage Notes (Signed)
Per EMS- Patient reports that she was walking out into her garage and slipped on a rug. Patient fell back onto her back and head. Hematoma present to the back of her head. Patient also c/o posterior neck pain. C-collar placed.  No LOC or blood thinners

## 2022-05-16 ENCOUNTER — Other Ambulatory Visit (HOSPITAL_COMMUNITY): Payer: Self-pay | Admitting: Family Medicine

## 2022-05-16 ENCOUNTER — Ambulatory Visit (HOSPITAL_COMMUNITY)
Admission: RE | Admit: 2022-05-16 | Discharge: 2022-05-16 | Disposition: A | Discharge status: Home / Self Care | Payer: Medicare PPO | Source: Ambulatory Visit | Attending: Family Medicine | Admitting: Family Medicine

## 2022-05-16 ENCOUNTER — Other Ambulatory Visit: Payer: Medicare PPO

## 2022-05-16 ENCOUNTER — Other Ambulatory Visit: Payer: Self-pay | Admitting: Family Medicine

## 2022-05-16 DIAGNOSIS — S0990XD Unspecified injury of head, subsequent encounter: Secondary | ICD-10-CM | POA: Diagnosis present

## 2022-05-16 DIAGNOSIS — R519 Headache, unspecified: Secondary | ICD-10-CM | POA: Diagnosis present

## 2022-05-30 ENCOUNTER — Other Ambulatory Visit: Payer: Self-pay | Admitting: Urology

## 2022-05-30 DIAGNOSIS — C641 Malignant neoplasm of right kidney, except renal pelvis: Secondary | ICD-10-CM

## 2022-06-19 ENCOUNTER — Ambulatory Visit
Admission: RE | Admit: 2022-06-19 | Discharge: 2022-06-19 | Disposition: A | Discharge status: Home / Self Care | Payer: Medicare PPO | Source: Ambulatory Visit | Attending: Urology | Admitting: Urology

## 2022-06-19 DIAGNOSIS — C641 Malignant neoplasm of right kidney, except renal pelvis: Secondary | ICD-10-CM

## 2022-06-19 MED ORDER — GADOPICLENOL 0.5 MMOL/ML IV SOLN
10.0000 mL | Freq: Once | INTRAVENOUS | Status: AC | PRN
  Administered 2022-06-19: 10 mL via INTRAVENOUS

## 2022-08-19 ENCOUNTER — Ambulatory Visit: Payer: Medicare Other

## 2022-08-19 ENCOUNTER — Other Ambulatory Visit: Payer: Medicare Other

## 2022-09-02 DIAGNOSIS — E785 Hyperlipidemia, unspecified: Secondary | ICD-10-CM | POA: Diagnosis not present

## 2022-09-02 DIAGNOSIS — E1129 Type 2 diabetes mellitus with other diabetic kidney complication: Secondary | ICD-10-CM | POA: Diagnosis not present

## 2022-09-10 DIAGNOSIS — Z6834 Body mass index (BMI) 34.0-34.9, adult: Secondary | ICD-10-CM | POA: Diagnosis not present

## 2022-09-10 DIAGNOSIS — E039 Hypothyroidism, unspecified: Secondary | ICD-10-CM | POA: Diagnosis not present

## 2022-09-10 DIAGNOSIS — F3341 Major depressive disorder, recurrent, in partial remission: Secondary | ICD-10-CM | POA: Diagnosis not present

## 2022-09-10 DIAGNOSIS — I10 Essential (primary) hypertension: Secondary | ICD-10-CM | POA: Diagnosis not present

## 2022-09-10 DIAGNOSIS — M810 Age-related osteoporosis without current pathological fracture: Secondary | ICD-10-CM | POA: Diagnosis not present

## 2022-09-10 DIAGNOSIS — N1832 Chronic kidney disease, stage 3b: Secondary | ICD-10-CM | POA: Diagnosis not present

## 2022-09-10 DIAGNOSIS — E1129 Type 2 diabetes mellitus with other diabetic kidney complication: Secondary | ICD-10-CM | POA: Diagnosis not present

## 2022-09-10 DIAGNOSIS — G4700 Insomnia, unspecified: Secondary | ICD-10-CM | POA: Diagnosis not present

## 2022-09-10 DIAGNOSIS — S060X0S Concussion without loss of consciousness, sequela: Secondary | ICD-10-CM | POA: Diagnosis not present

## 2022-09-24 DIAGNOSIS — F331 Major depressive disorder, recurrent, moderate: Secondary | ICD-10-CM | POA: Diagnosis not present

## 2022-09-24 DIAGNOSIS — N1832 Chronic kidney disease, stage 3b: Secondary | ICD-10-CM | POA: Diagnosis not present

## 2022-10-06 DIAGNOSIS — E119 Type 2 diabetes mellitus without complications: Secondary | ICD-10-CM | POA: Diagnosis not present

## 2022-10-06 DIAGNOSIS — M25561 Pain in right knee: Secondary | ICD-10-CM | POA: Diagnosis not present

## 2022-10-06 DIAGNOSIS — M25571 Pain in right ankle and joints of right foot: Secondary | ICD-10-CM | POA: Diagnosis not present

## 2022-10-15 ENCOUNTER — Other Ambulatory Visit: Payer: Self-pay | Admitting: Sports Medicine

## 2022-10-15 ENCOUNTER — Ambulatory Visit
Admission: RE | Admit: 2022-10-15 | Discharge: 2022-10-15 | Disposition: A | Discharge status: Home / Self Care | Payer: Medicare PPO | Source: Ambulatory Visit | Attending: Sports Medicine | Admitting: Sports Medicine

## 2022-10-15 DIAGNOSIS — M25561 Pain in right knee: Secondary | ICD-10-CM

## 2022-10-15 DIAGNOSIS — M25571 Pain in right ankle and joints of right foot: Secondary | ICD-10-CM

## 2022-10-23 DIAGNOSIS — F331 Major depressive disorder, recurrent, moderate: Secondary | ICD-10-CM | POA: Diagnosis not present

## 2022-11-04 DIAGNOSIS — M1711 Unilateral primary osteoarthritis, right knee: Secondary | ICD-10-CM | POA: Diagnosis not present

## 2022-11-04 DIAGNOSIS — M19071 Primary osteoarthritis, right ankle and foot: Secondary | ICD-10-CM | POA: Diagnosis not present

## 2022-11-26 ENCOUNTER — Other Ambulatory Visit: Payer: Self-pay | Admitting: Family Medicine

## 2022-11-26 DIAGNOSIS — B9689 Other specified bacterial agents as the cause of diseases classified elsewhere: Secondary | ICD-10-CM | POA: Diagnosis not present

## 2022-11-26 DIAGNOSIS — J329 Chronic sinusitis, unspecified: Secondary | ICD-10-CM | POA: Diagnosis not present

## 2022-11-26 DIAGNOSIS — M810 Age-related osteoporosis without current pathological fracture: Secondary | ICD-10-CM

## 2022-11-26 DIAGNOSIS — I10 Essential (primary) hypertension: Secondary | ICD-10-CM | POA: Diagnosis not present

## 2022-11-26 DIAGNOSIS — F3341 Major depressive disorder, recurrent, in partial remission: Secondary | ICD-10-CM | POA: Diagnosis not present

## 2022-11-26 DIAGNOSIS — Z6833 Body mass index (BMI) 33.0-33.9, adult: Secondary | ICD-10-CM | POA: Diagnosis not present

## 2022-11-26 DIAGNOSIS — J302 Other seasonal allergic rhinitis: Secondary | ICD-10-CM | POA: Diagnosis not present

## 2022-11-26 DIAGNOSIS — K5903 Drug induced constipation: Secondary | ICD-10-CM | POA: Diagnosis not present

## 2022-11-27 ENCOUNTER — Inpatient Hospital Stay: Admission: RE | Admit: 2022-11-27 | Payer: Medicare Other | Source: Ambulatory Visit

## 2022-11-27 ENCOUNTER — Ambulatory Visit: Payer: Medicare Other

## 2022-12-08 DIAGNOSIS — F3341 Major depressive disorder, recurrent, in partial remission: Secondary | ICD-10-CM | POA: Diagnosis not present

## 2022-12-08 DIAGNOSIS — Z6832 Body mass index (BMI) 32.0-32.9, adult: Secondary | ICD-10-CM | POA: Diagnosis not present

## 2022-12-08 DIAGNOSIS — N184 Chronic kidney disease, stage 4 (severe): Secondary | ICD-10-CM | POA: Diagnosis not present

## 2022-12-08 DIAGNOSIS — R110 Nausea: Secondary | ICD-10-CM | POA: Diagnosis not present

## 2022-12-08 DIAGNOSIS — R1013 Epigastric pain: Secondary | ICD-10-CM | POA: Diagnosis not present

## 2022-12-15 ENCOUNTER — Other Ambulatory Visit: Payer: Self-pay | Admitting: Family Medicine

## 2022-12-15 DIAGNOSIS — N184 Chronic kidney disease, stage 4 (severe): Secondary | ICD-10-CM

## 2022-12-15 DIAGNOSIS — K859 Acute pancreatitis without necrosis or infection, unspecified: Secondary | ICD-10-CM

## 2022-12-19 DIAGNOSIS — N184 Chronic kidney disease, stage 4 (severe): Secondary | ICD-10-CM | POA: Diagnosis not present

## 2023-01-07 DIAGNOSIS — R2689 Other abnormalities of gait and mobility: Secondary | ICD-10-CM | POA: Diagnosis not present

## 2023-01-07 DIAGNOSIS — M810 Age-related osteoporosis without current pathological fracture: Secondary | ICD-10-CM | POA: Diagnosis not present

## 2023-01-07 DIAGNOSIS — F3341 Major depressive disorder, recurrent, in partial remission: Secondary | ICD-10-CM | POA: Diagnosis not present

## 2023-01-07 DIAGNOSIS — Z9181 History of falling: Secondary | ICD-10-CM | POA: Diagnosis not present

## 2023-01-07 DIAGNOSIS — E785 Hyperlipidemia, unspecified: Secondary | ICD-10-CM | POA: Diagnosis not present

## 2023-01-07 DIAGNOSIS — E1129 Type 2 diabetes mellitus with other diabetic kidney complication: Secondary | ICD-10-CM | POA: Diagnosis not present

## 2023-01-07 DIAGNOSIS — I10 Essential (primary) hypertension: Secondary | ICD-10-CM | POA: Diagnosis not present

## 2023-01-07 DIAGNOSIS — N184 Chronic kidney disease, stage 4 (severe): Secondary | ICD-10-CM | POA: Diagnosis not present

## 2023-01-07 DIAGNOSIS — E039 Hypothyroidism, unspecified: Secondary | ICD-10-CM | POA: Diagnosis not present

## 2023-01-07 DIAGNOSIS — E1122 Type 2 diabetes mellitus with diabetic chronic kidney disease: Secondary | ICD-10-CM | POA: Diagnosis not present

## 2023-01-08 ENCOUNTER — Ambulatory Visit: Payer: Medicare Other

## 2023-01-09 DIAGNOSIS — N184 Chronic kidney disease, stage 4 (severe): Secondary | ICD-10-CM | POA: Diagnosis not present

## 2023-01-22 DIAGNOSIS — R2689 Other abnormalities of gait and mobility: Secondary | ICD-10-CM | POA: Diagnosis not present

## 2023-01-22 DIAGNOSIS — H8113 Benign paroxysmal vertigo, bilateral: Secondary | ICD-10-CM | POA: Diagnosis not present

## 2023-01-28 ENCOUNTER — Ambulatory Visit
Admission: RE | Admit: 2023-01-28 | Discharge: 2023-01-28 | Disposition: A | Discharge status: Home / Self Care | Payer: Medicare PPO | Source: Ambulatory Visit | Attending: Family Medicine | Admitting: Family Medicine

## 2023-01-28 DIAGNOSIS — K828 Other specified diseases of gallbladder: Secondary | ICD-10-CM | POA: Diagnosis not present

## 2023-01-28 DIAGNOSIS — K859 Acute pancreatitis without necrosis or infection, unspecified: Secondary | ICD-10-CM

## 2023-01-28 DIAGNOSIS — K769 Liver disease, unspecified: Secondary | ICD-10-CM | POA: Diagnosis not present

## 2023-01-28 DIAGNOSIS — N184 Chronic kidney disease, stage 4 (severe): Secondary | ICD-10-CM

## 2023-01-29 DIAGNOSIS — R2689 Other abnormalities of gait and mobility: Secondary | ICD-10-CM | POA: Diagnosis not present

## 2023-01-29 DIAGNOSIS — H8113 Benign paroxysmal vertigo, bilateral: Secondary | ICD-10-CM | POA: Diagnosis not present

## 2023-02-03 DIAGNOSIS — R2689 Other abnormalities of gait and mobility: Secondary | ICD-10-CM | POA: Diagnosis not present

## 2023-02-03 DIAGNOSIS — H8113 Benign paroxysmal vertigo, bilateral: Secondary | ICD-10-CM | POA: Diagnosis not present

## 2023-02-04 DIAGNOSIS — E039 Hypothyroidism, unspecified: Secondary | ICD-10-CM | POA: Diagnosis not present

## 2023-02-04 DIAGNOSIS — G4700 Insomnia, unspecified: Secondary | ICD-10-CM | POA: Diagnosis not present

## 2023-02-04 DIAGNOSIS — E669 Obesity, unspecified: Secondary | ICD-10-CM | POA: Diagnosis not present

## 2023-02-04 DIAGNOSIS — K219 Gastro-esophageal reflux disease without esophagitis: Secondary | ICD-10-CM | POA: Diagnosis not present

## 2023-02-04 DIAGNOSIS — R269 Unspecified abnormalities of gait and mobility: Secondary | ICD-10-CM | POA: Diagnosis not present

## 2023-02-04 DIAGNOSIS — M810 Age-related osteoporosis without current pathological fracture: Secondary | ICD-10-CM | POA: Diagnosis not present

## 2023-02-04 DIAGNOSIS — E785 Hyperlipidemia, unspecified: Secondary | ICD-10-CM | POA: Diagnosis not present

## 2023-02-04 DIAGNOSIS — M1990 Unspecified osteoarthritis, unspecified site: Secondary | ICD-10-CM | POA: Diagnosis not present

## 2023-02-04 DIAGNOSIS — K5900 Constipation, unspecified: Secondary | ICD-10-CM | POA: Diagnosis not present

## 2023-02-05 DIAGNOSIS — H8113 Benign paroxysmal vertigo, bilateral: Secondary | ICD-10-CM | POA: Diagnosis not present

## 2023-02-05 DIAGNOSIS — R2689 Other abnormalities of gait and mobility: Secondary | ICD-10-CM | POA: Diagnosis not present

## 2023-02-10 DIAGNOSIS — H8113 Benign paroxysmal vertigo, bilateral: Secondary | ICD-10-CM | POA: Diagnosis not present

## 2023-02-10 DIAGNOSIS — R2689 Other abnormalities of gait and mobility: Secondary | ICD-10-CM | POA: Diagnosis not present

## 2023-02-12 DIAGNOSIS — R2689 Other abnormalities of gait and mobility: Secondary | ICD-10-CM | POA: Diagnosis not present

## 2023-02-12 DIAGNOSIS — H8113 Benign paroxysmal vertigo, bilateral: Secondary | ICD-10-CM | POA: Diagnosis not present

## 2023-02-16 DIAGNOSIS — I129 Hypertensive chronic kidney disease with stage 1 through stage 4 chronic kidney disease, or unspecified chronic kidney disease: Secondary | ICD-10-CM | POA: Diagnosis not present

## 2023-02-16 DIAGNOSIS — N184 Chronic kidney disease, stage 4 (severe): Secondary | ICD-10-CM | POA: Diagnosis not present

## 2023-02-16 DIAGNOSIS — E1122 Type 2 diabetes mellitus with diabetic chronic kidney disease: Secondary | ICD-10-CM | POA: Diagnosis not present

## 2023-02-16 DIAGNOSIS — R809 Proteinuria, unspecified: Secondary | ICD-10-CM | POA: Diagnosis not present

## 2023-02-19 DIAGNOSIS — R2689 Other abnormalities of gait and mobility: Secondary | ICD-10-CM | POA: Diagnosis not present

## 2023-02-19 DIAGNOSIS — H8113 Benign paroxysmal vertigo, bilateral: Secondary | ICD-10-CM | POA: Diagnosis not present

## 2023-02-24 DIAGNOSIS — R2689 Other abnormalities of gait and mobility: Secondary | ICD-10-CM | POA: Diagnosis not present

## 2023-02-24 DIAGNOSIS — H8113 Benign paroxysmal vertigo, bilateral: Secondary | ICD-10-CM | POA: Diagnosis not present

## 2023-03-10 DIAGNOSIS — H8113 Benign paroxysmal vertigo, bilateral: Secondary | ICD-10-CM | POA: Diagnosis not present

## 2023-03-10 DIAGNOSIS — R2689 Other abnormalities of gait and mobility: Secondary | ICD-10-CM | POA: Diagnosis not present

## 2023-03-11 DIAGNOSIS — E1129 Type 2 diabetes mellitus with other diabetic kidney complication: Secondary | ICD-10-CM | POA: Diagnosis not present

## 2023-03-11 DIAGNOSIS — R6889 Other general symptoms and signs: Secondary | ICD-10-CM | POA: Diagnosis not present

## 2023-03-11 DIAGNOSIS — Z6833 Body mass index (BMI) 33.0-33.9, adult: Secondary | ICD-10-CM | POA: Diagnosis not present

## 2023-03-11 DIAGNOSIS — N184 Chronic kidney disease, stage 4 (severe): Secondary | ICD-10-CM | POA: Diagnosis not present

## 2023-03-11 DIAGNOSIS — M1711 Unilateral primary osteoarthritis, right knee: Secondary | ICD-10-CM | POA: Diagnosis not present

## 2023-03-17 DIAGNOSIS — R2689 Other abnormalities of gait and mobility: Secondary | ICD-10-CM | POA: Diagnosis not present

## 2023-03-17 DIAGNOSIS — H8113 Benign paroxysmal vertigo, bilateral: Secondary | ICD-10-CM | POA: Diagnosis not present

## 2023-03-19 ENCOUNTER — Other Ambulatory Visit (HOSPITAL_COMMUNITY): Payer: Self-pay | Admitting: Family Medicine

## 2023-03-19 DIAGNOSIS — I739 Peripheral vascular disease, unspecified: Secondary | ICD-10-CM

## 2023-03-24 DIAGNOSIS — E669 Obesity, unspecified: Secondary | ICD-10-CM | POA: Diagnosis not present

## 2023-03-24 DIAGNOSIS — Z0000 Encounter for general adult medical examination without abnormal findings: Secondary | ICD-10-CM | POA: Diagnosis not present

## 2023-03-24 DIAGNOSIS — Z1389 Encounter for screening for other disorder: Secondary | ICD-10-CM | POA: Diagnosis not present

## 2023-03-24 DIAGNOSIS — Z9181 History of falling: Secondary | ICD-10-CM | POA: Diagnosis not present

## 2023-03-25 DIAGNOSIS — T7840XA Allergy, unspecified, initial encounter: Secondary | ICD-10-CM | POA: Diagnosis not present

## 2023-03-26 DIAGNOSIS — H8113 Benign paroxysmal vertigo, bilateral: Secondary | ICD-10-CM | POA: Diagnosis not present

## 2023-03-26 DIAGNOSIS — R2689 Other abnormalities of gait and mobility: Secondary | ICD-10-CM | POA: Diagnosis not present

## 2023-03-31 ENCOUNTER — Ambulatory Visit (HOSPITAL_COMMUNITY)
Admission: RE | Admit: 2023-03-31 | Discharge: 2023-03-31 | Disposition: A | Discharge status: Home / Self Care | Payer: Medicare PPO | Source: Ambulatory Visit | Attending: Internal Medicine | Admitting: Internal Medicine

## 2023-03-31 DIAGNOSIS — I739 Peripheral vascular disease, unspecified: Secondary | ICD-10-CM | POA: Insufficient documentation

## 2023-03-31 DIAGNOSIS — R2689 Other abnormalities of gait and mobility: Secondary | ICD-10-CM | POA: Diagnosis not present

## 2023-03-31 DIAGNOSIS — H8113 Benign paroxysmal vertigo, bilateral: Secondary | ICD-10-CM | POA: Diagnosis not present

## 2023-04-02 ENCOUNTER — Encounter (HOSPITAL_COMMUNITY): Payer: Medicare PPO

## 2023-04-07 DIAGNOSIS — R2689 Other abnormalities of gait and mobility: Secondary | ICD-10-CM | POA: Diagnosis not present

## 2023-04-07 DIAGNOSIS — H8113 Benign paroxysmal vertigo, bilateral: Secondary | ICD-10-CM | POA: Diagnosis not present

## 2023-04-08 DIAGNOSIS — I10 Essential (primary) hypertension: Secondary | ICD-10-CM | POA: Diagnosis not present

## 2023-04-08 DIAGNOSIS — E1129 Type 2 diabetes mellitus with other diabetic kidney complication: Secondary | ICD-10-CM | POA: Diagnosis not present

## 2023-04-08 DIAGNOSIS — M1711 Unilateral primary osteoarthritis, right knee: Secondary | ICD-10-CM | POA: Diagnosis not present

## 2023-04-08 DIAGNOSIS — E1122 Type 2 diabetes mellitus with diabetic chronic kidney disease: Secondary | ICD-10-CM | POA: Diagnosis not present

## 2023-04-08 DIAGNOSIS — N184 Chronic kidney disease, stage 4 (severe): Secondary | ICD-10-CM | POA: Diagnosis not present

## 2023-04-08 DIAGNOSIS — Z6833 Body mass index (BMI) 33.0-33.9, adult: Secondary | ICD-10-CM | POA: Diagnosis not present

## 2023-04-28 DIAGNOSIS — H8113 Benign paroxysmal vertigo, bilateral: Secondary | ICD-10-CM | POA: Diagnosis not present

## 2023-04-28 DIAGNOSIS — M1711 Unilateral primary osteoarthritis, right knee: Secondary | ICD-10-CM | POA: Diagnosis not present

## 2023-04-28 DIAGNOSIS — R2689 Other abnormalities of gait and mobility: Secondary | ICD-10-CM | POA: Diagnosis not present

## 2023-05-01 DIAGNOSIS — N184 Chronic kidney disease, stage 4 (severe): Secondary | ICD-10-CM | POA: Diagnosis not present

## 2023-05-13 DIAGNOSIS — E119 Type 2 diabetes mellitus without complications: Secondary | ICD-10-CM | POA: Diagnosis not present

## 2023-05-13 DIAGNOSIS — R053 Chronic cough: Secondary | ICD-10-CM | POA: Diagnosis not present

## 2023-05-13 DIAGNOSIS — Z6833 Body mass index (BMI) 33.0-33.9, adult: Secondary | ICD-10-CM | POA: Diagnosis not present

## 2023-06-02 ENCOUNTER — Inpatient Hospital Stay: Admission: RE | Admit: 2023-06-02 | Payer: Medicare PPO | Source: Ambulatory Visit

## 2023-06-02 DIAGNOSIS — R809 Proteinuria, unspecified: Secondary | ICD-10-CM | POA: Diagnosis not present

## 2023-06-02 DIAGNOSIS — N184 Chronic kidney disease, stage 4 (severe): Secondary | ICD-10-CM | POA: Diagnosis not present

## 2023-06-02 DIAGNOSIS — E1122 Type 2 diabetes mellitus with diabetic chronic kidney disease: Secondary | ICD-10-CM | POA: Diagnosis not present

## 2023-06-02 DIAGNOSIS — I129 Hypertensive chronic kidney disease with stage 1 through stage 4 chronic kidney disease, or unspecified chronic kidney disease: Secondary | ICD-10-CM | POA: Diagnosis not present

## 2023-07-17 ENCOUNTER — Ambulatory Visit
Admission: RE | Admit: 2023-07-17 | Discharge: 2023-07-17 | Disposition: A | Discharge status: Home / Self Care | Payer: Medicare PPO | Source: Ambulatory Visit | Attending: Family Medicine | Admitting: Family Medicine

## 2023-07-17 DIAGNOSIS — E2839 Other primary ovarian failure: Secondary | ICD-10-CM | POA: Diagnosis not present

## 2023-07-17 DIAGNOSIS — M8588 Other specified disorders of bone density and structure, other site: Secondary | ICD-10-CM | POA: Diagnosis not present

## 2023-07-17 DIAGNOSIS — M810 Age-related osteoporosis without current pathological fracture: Secondary | ICD-10-CM

## 2023-07-17 DIAGNOSIS — N958 Other specified menopausal and perimenopausal disorders: Secondary | ICD-10-CM | POA: Diagnosis not present

## 2023-08-31 DIAGNOSIS — E1129 Type 2 diabetes mellitus with other diabetic kidney complication: Secondary | ICD-10-CM | POA: Diagnosis not present

## 2023-08-31 DIAGNOSIS — N184 Chronic kidney disease, stage 4 (severe): Secondary | ICD-10-CM | POA: Diagnosis not present

## 2023-08-31 DIAGNOSIS — R200 Anesthesia of skin: Secondary | ICD-10-CM | POA: Diagnosis not present

## 2023-08-31 DIAGNOSIS — R635 Abnormal weight gain: Secondary | ICD-10-CM | POA: Diagnosis not present

## 2023-09-03 DIAGNOSIS — F331 Major depressive disorder, recurrent, moderate: Secondary | ICD-10-CM | POA: Diagnosis not present

## 2023-09-03 DIAGNOSIS — I10 Essential (primary) hypertension: Secondary | ICD-10-CM | POA: Diagnosis not present

## 2023-09-03 DIAGNOSIS — Z6836 Body mass index (BMI) 36.0-36.9, adult: Secondary | ICD-10-CM | POA: Diagnosis not present

## 2023-09-03 DIAGNOSIS — E1129 Type 2 diabetes mellitus with other diabetic kidney complication: Secondary | ICD-10-CM | POA: Diagnosis not present

## 2023-09-03 DIAGNOSIS — N184 Chronic kidney disease, stage 4 (severe): Secondary | ICD-10-CM | POA: Diagnosis not present

## 2023-09-03 DIAGNOSIS — R635 Abnormal weight gain: Secondary | ICD-10-CM | POA: Diagnosis not present

## 2023-09-03 DIAGNOSIS — E1122 Type 2 diabetes mellitus with diabetic chronic kidney disease: Secondary | ICD-10-CM | POA: Diagnosis not present

## 2023-09-03 DIAGNOSIS — E785 Hyperlipidemia, unspecified: Secondary | ICD-10-CM | POA: Diagnosis not present

## 2023-09-18 DIAGNOSIS — N184 Chronic kidney disease, stage 4 (severe): Secondary | ICD-10-CM | POA: Diagnosis not present

## 2023-09-23 DIAGNOSIS — I129 Hypertensive chronic kidney disease with stage 1 through stage 4 chronic kidney disease, or unspecified chronic kidney disease: Secondary | ICD-10-CM | POA: Diagnosis not present

## 2023-09-23 DIAGNOSIS — N189 Chronic kidney disease, unspecified: Secondary | ICD-10-CM | POA: Diagnosis not present

## 2023-09-23 DIAGNOSIS — N184 Chronic kidney disease, stage 4 (severe): Secondary | ICD-10-CM | POA: Diagnosis not present

## 2023-09-23 DIAGNOSIS — N2581 Secondary hyperparathyroidism of renal origin: Secondary | ICD-10-CM | POA: Diagnosis not present

## 2023-09-23 DIAGNOSIS — D631 Anemia in chronic kidney disease: Secondary | ICD-10-CM | POA: Diagnosis not present

## 2023-09-23 DIAGNOSIS — R809 Proteinuria, unspecified: Secondary | ICD-10-CM | POA: Diagnosis not present

## 2023-09-23 DIAGNOSIS — E1122 Type 2 diabetes mellitus with diabetic chronic kidney disease: Secondary | ICD-10-CM | POA: Diagnosis not present

## 2023-10-27 DIAGNOSIS — E039 Hypothyroidism, unspecified: Secondary | ICD-10-CM | POA: Diagnosis not present

## 2023-10-27 DIAGNOSIS — E1129 Type 2 diabetes mellitus with other diabetic kidney complication: Secondary | ICD-10-CM | POA: Diagnosis not present

## 2023-10-27 DIAGNOSIS — N184 Chronic kidney disease, stage 4 (severe): Secondary | ICD-10-CM | POA: Diagnosis not present

## 2023-10-27 DIAGNOSIS — E785 Hyperlipidemia, unspecified: Secondary | ICD-10-CM | POA: Diagnosis not present

## 2023-12-15 DIAGNOSIS — F331 Major depressive disorder, recurrent, moderate: Secondary | ICD-10-CM | POA: Diagnosis not present

## 2023-12-15 DIAGNOSIS — Z6834 Body mass index (BMI) 34.0-34.9, adult: Secondary | ICD-10-CM | POA: Diagnosis not present

## 2023-12-15 DIAGNOSIS — R5382 Chronic fatigue, unspecified: Secondary | ICD-10-CM | POA: Diagnosis not present

## 2023-12-15 DIAGNOSIS — J040 Acute laryngitis: Secondary | ICD-10-CM | POA: Diagnosis not present

## 2023-12-17 DIAGNOSIS — N184 Chronic kidney disease, stage 4 (severe): Secondary | ICD-10-CM | POA: Diagnosis not present

## 2023-12-23 DIAGNOSIS — D631 Anemia in chronic kidney disease: Secondary | ICD-10-CM | POA: Diagnosis not present

## 2023-12-23 DIAGNOSIS — R809 Proteinuria, unspecified: Secondary | ICD-10-CM | POA: Diagnosis not present

## 2023-12-23 DIAGNOSIS — E1122 Type 2 diabetes mellitus with diabetic chronic kidney disease: Secondary | ICD-10-CM | POA: Diagnosis not present

## 2023-12-23 DIAGNOSIS — N2581 Secondary hyperparathyroidism of renal origin: Secondary | ICD-10-CM | POA: Diagnosis not present

## 2023-12-23 DIAGNOSIS — N184 Chronic kidney disease, stage 4 (severe): Secondary | ICD-10-CM | POA: Diagnosis not present

## 2023-12-23 DIAGNOSIS — I129 Hypertensive chronic kidney disease with stage 1 through stage 4 chronic kidney disease, or unspecified chronic kidney disease: Secondary | ICD-10-CM | POA: Diagnosis not present

## 2024-02-03 ENCOUNTER — Telehealth: Payer: Self-pay | Admitting: Pharmacist

## 2024-02-03 NOTE — Progress Notes (Signed)
   02/03/2024  Patient ID: April Franco, female   DOB: [birth year], 83 y.o.   MRN: [id number]  Patient appeared on insurance report for at-risk for failing 2025 metric: Medication Adherence for Hypertension [facility])   Outreach to the patient was Successful.   Medication: Losartan 100mg  Last fill date: 09/03/23 90DS  Recent fill on 01/14/24 for 90DS.   Patient reports concerns surrounding lack of efficacy with Ozempic. Said she is following the dietician's recommendations and losing <1 lb per week, while also having significant indigestion on the medication. Wondering about necessity of medication going forward- will discuss at upcoming PCP visit.   As for BP, reports it has been around 120/65's. Advised this is a really good BP reading and we do NOT normally decrease the medications unless she is symptomatic (dizziness, falls, etc).    Delvin File, PharmD [facility]  Phone Number: [phone]

## 2024-03-16 DIAGNOSIS — E039 Hypothyroidism, unspecified: Secondary | ICD-10-CM | POA: Diagnosis not present

## 2024-03-16 DIAGNOSIS — E1122 Type 2 diabetes mellitus with diabetic chronic kidney disease: Secondary | ICD-10-CM | POA: Diagnosis not present

## 2024-03-16 DIAGNOSIS — E785 Hyperlipidemia, unspecified: Secondary | ICD-10-CM | POA: Diagnosis not present

## 2024-03-17 DIAGNOSIS — F4320 Adjustment disorder, unspecified: Secondary | ICD-10-CM | POA: Diagnosis not present

## 2024-03-17 DIAGNOSIS — I10 Essential (primary) hypertension: Secondary | ICD-10-CM | POA: Diagnosis not present

## 2024-03-17 DIAGNOSIS — S20212A Contusion of left front wall of thorax, initial encounter: Secondary | ICD-10-CM | POA: Diagnosis not present

## 2024-03-17 DIAGNOSIS — E785 Hyperlipidemia, unspecified: Secondary | ICD-10-CM | POA: Diagnosis not present

## 2024-03-17 DIAGNOSIS — F332 Major depressive disorder, recurrent severe without psychotic features: Secondary | ICD-10-CM | POA: Diagnosis not present

## 2024-03-17 DIAGNOSIS — E039 Hypothyroidism, unspecified: Secondary | ICD-10-CM | POA: Diagnosis not present

## 2024-03-17 DIAGNOSIS — N184 Chronic kidney disease, stage 4 (severe): Secondary | ICD-10-CM | POA: Diagnosis not present

## 2024-03-17 DIAGNOSIS — E1129 Type 2 diabetes mellitus with other diabetic kidney complication: Secondary | ICD-10-CM | POA: Diagnosis not present

## 2024-04-13 DIAGNOSIS — N184 Chronic kidney disease, stage 4 (severe): Secondary | ICD-10-CM | POA: Diagnosis not present

## 2024-04-21 DIAGNOSIS — Z634 Disappearance and death of family member: Secondary | ICD-10-CM | POA: Diagnosis not present

## 2024-04-21 DIAGNOSIS — D631 Anemia in chronic kidney disease: Secondary | ICD-10-CM | POA: Diagnosis not present

## 2024-04-21 DIAGNOSIS — N184 Chronic kidney disease, stage 4 (severe): Secondary | ICD-10-CM | POA: Diagnosis not present

## 2024-04-21 DIAGNOSIS — I129 Hypertensive chronic kidney disease with stage 1 through stage 4 chronic kidney disease, or unspecified chronic kidney disease: Secondary | ICD-10-CM | POA: Diagnosis not present

## 2024-04-21 DIAGNOSIS — E1122 Type 2 diabetes mellitus with diabetic chronic kidney disease: Secondary | ICD-10-CM | POA: Diagnosis not present

## 2024-04-21 DIAGNOSIS — N2581 Secondary hyperparathyroidism of renal origin: Secondary | ICD-10-CM | POA: Diagnosis not present

## 2024-04-21 DIAGNOSIS — R809 Proteinuria, unspecified: Secondary | ICD-10-CM | POA: Diagnosis not present

## 2024-05-30 ENCOUNTER — Other Ambulatory Visit (HOSPITAL_COMMUNITY): Payer: Self-pay

## 2024-05-30 MED ORDER — OZEMPIC (2 MG/DOSE) 8 MG/3ML ~~LOC~~ SOPN
2.0000 mg | PEN_INJECTOR | SUBCUTANEOUS | 11 refills | Status: AC
  Filled 2024-05-30: qty 3, 28d supply, fill #0

## 2024-05-31 ENCOUNTER — Other Ambulatory Visit (HOSPITAL_COMMUNITY): Payer: Self-pay

## 2024-06-16 ENCOUNTER — Other Ambulatory Visit (HOSPITAL_COMMUNITY): Payer: Self-pay

## 2024-06-20 ENCOUNTER — Other Ambulatory Visit (HOSPITAL_COMMUNITY): Payer: Self-pay

## 2024-06-24 DIAGNOSIS — I700 Atherosclerosis of aorta: Secondary | ICD-10-CM | POA: Diagnosis not present

## 2024-06-24 DIAGNOSIS — I129 Hypertensive chronic kidney disease with stage 1 through stage 4 chronic kidney disease, or unspecified chronic kidney disease: Secondary | ICD-10-CM | POA: Diagnosis not present

## 2024-06-24 DIAGNOSIS — N184 Chronic kidney disease, stage 4 (severe): Secondary | ICD-10-CM | POA: Diagnosis not present

## 2024-06-24 DIAGNOSIS — E039 Hypothyroidism, unspecified: Secondary | ICD-10-CM | POA: Diagnosis not present

## 2024-06-24 DIAGNOSIS — E1122 Type 2 diabetes mellitus with diabetic chronic kidney disease: Secondary | ICD-10-CM | POA: Diagnosis not present

## 2024-06-24 DIAGNOSIS — F329 Major depressive disorder, single episode, unspecified: Secondary | ICD-10-CM | POA: Diagnosis not present

## 2024-06-24 DIAGNOSIS — E785 Hyperlipidemia, unspecified: Secondary | ICD-10-CM | POA: Diagnosis not present

## 2024-06-24 DIAGNOSIS — M810 Age-related osteoporosis without current pathological fracture: Secondary | ICD-10-CM | POA: Diagnosis not present

## 2024-06-24 DIAGNOSIS — F411 Generalized anxiety disorder: Secondary | ICD-10-CM | POA: Diagnosis not present
